# Patient Record
Sex: Female | Born: 1965 | Race: Black or African American | Hispanic: No | Marital: Married | Smoking: Current every day smoker
Health system: Southern US, Community
[De-identification: ages and names within clinical notes are randomized; demographics above are authoritative.]

## PROBLEM LIST (undated history)

## (undated) DIAGNOSIS — I10 Essential (primary) hypertension: Secondary | ICD-10-CM

## (undated) HISTORY — PX: FOOT SURGERY: SHX648

## (undated) HISTORY — PX: TUBAL LIGATION: SHX77

---

## 2015-12-07 ENCOUNTER — Emergency Department (HOSPITAL_BASED_OUTPATIENT_CLINIC_OR_DEPARTMENT_OTHER)
Admission: EM | Admit: 2015-12-07 | Discharge: 2015-12-07 | Disposition: A | Discharge status: Home / Self Care | Payer: Managed Care, Other (non HMO) | Attending: Emergency Medicine | Admitting: Emergency Medicine

## 2015-12-07 ENCOUNTER — Encounter (HOSPITAL_BASED_OUTPATIENT_CLINIC_OR_DEPARTMENT_OTHER): Payer: Self-pay | Admitting: Emergency Medicine

## 2015-12-07 ENCOUNTER — Emergency Department (HOSPITAL_BASED_OUTPATIENT_CLINIC_OR_DEPARTMENT_OTHER): Payer: Managed Care, Other (non HMO)

## 2015-12-07 DIAGNOSIS — Y998 Other external cause status: Secondary | ICD-10-CM | POA: Diagnosis not present

## 2015-12-07 DIAGNOSIS — S3992XA Unspecified injury of lower back, initial encounter: Secondary | ICD-10-CM | POA: Diagnosis not present

## 2015-12-07 DIAGNOSIS — F17200 Nicotine dependence, unspecified, uncomplicated: Secondary | ICD-10-CM | POA: Diagnosis not present

## 2015-12-07 DIAGNOSIS — Z79899 Other long term (current) drug therapy: Secondary | ICD-10-CM | POA: Insufficient documentation

## 2015-12-07 DIAGNOSIS — Y9389 Activity, other specified: Secondary | ICD-10-CM | POA: Insufficient documentation

## 2015-12-07 DIAGNOSIS — I10 Essential (primary) hypertension: Secondary | ICD-10-CM | POA: Insufficient documentation

## 2015-12-07 DIAGNOSIS — Y9289 Other specified places as the place of occurrence of the external cause: Secondary | ICD-10-CM | POA: Insufficient documentation

## 2015-12-07 DIAGNOSIS — S91002A Unspecified open wound, left ankle, initial encounter: Secondary | ICD-10-CM | POA: Diagnosis not present

## 2015-12-07 DIAGNOSIS — S99912A Unspecified injury of left ankle, initial encounter: Secondary | ICD-10-CM | POA: Diagnosis present

## 2015-12-07 DIAGNOSIS — S199XXA Unspecified injury of neck, initial encounter: Secondary | ICD-10-CM | POA: Diagnosis not present

## 2015-12-07 HISTORY — DX: Essential (primary) hypertension: I10

## 2015-12-07 MED ORDER — NAPROXEN 500 MG PO TABS: 500.0000 mg | ORAL_TABLET | Freq: Two times a day (BID) | ORAL | Status: DC

## 2015-12-07 MED ORDER — METHOCARBAMOL 500 MG PO TABS: 500.0000 mg | ORAL_TABLET | Freq: Two times a day (BID) | ORAL | Status: DC
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
## 2015-12-12 DIAGNOSIS — M778 Other enthesopathies, not elsewhere classified: Secondary | ICD-10-CM | POA: Insufficient documentation

## 2015-12-12 DIAGNOSIS — K219 Gastro-esophageal reflux disease without esophagitis: Secondary | ICD-10-CM | POA: Insufficient documentation

## 2015-12-12 DIAGNOSIS — M21619 Bunion of unspecified foot: Secondary | ICD-10-CM | POA: Insufficient documentation

## 2015-12-12 DIAGNOSIS — M25469 Effusion, unspecified knee: Secondary | ICD-10-CM | POA: Insufficient documentation

## 2015-12-12 DIAGNOSIS — J309 Allergic rhinitis, unspecified: Secondary | ICD-10-CM | POA: Insufficient documentation

## 2015-12-12 DIAGNOSIS — E7800 Pure hypercholesterolemia, unspecified: Secondary | ICD-10-CM | POA: Insufficient documentation

## 2015-12-12 DIAGNOSIS — M779 Enthesopathy, unspecified: Secondary | ICD-10-CM

## 2015-12-12 DIAGNOSIS — F17200 Nicotine dependence, unspecified, uncomplicated: Secondary | ICD-10-CM | POA: Insufficient documentation

## 2015-12-12 DIAGNOSIS — E669 Obesity, unspecified: Secondary | ICD-10-CM | POA: Insufficient documentation

## 2015-12-12 DIAGNOSIS — I10 Essential (primary) hypertension: Secondary | ICD-10-CM | POA: Insufficient documentation

## 2015-12-12 DIAGNOSIS — R7301 Impaired fasting glucose: Secondary | ICD-10-CM | POA: Insufficient documentation

## 2016-05-31 DIAGNOSIS — R319 Hematuria, unspecified: Secondary | ICD-10-CM | POA: Insufficient documentation

## 2016-05-31 DIAGNOSIS — Z0000 Encounter for general adult medical examination without abnormal findings: Secondary | ICD-10-CM | POA: Insufficient documentation

## 2017-06-06 DIAGNOSIS — S0300XA Dislocation of jaw, unspecified side, initial encounter: Secondary | ICD-10-CM | POA: Insufficient documentation

## 2018-03-14 ENCOUNTER — Ambulatory Visit (INDEPENDENT_AMBULATORY_CARE_PROVIDER_SITE_OTHER): Payer: BC Managed Care – PPO

## 2018-03-14 ENCOUNTER — Encounter: Payer: Self-pay | Admitting: Sports Medicine

## 2018-03-14 ENCOUNTER — Ambulatory Visit (INDEPENDENT_AMBULATORY_CARE_PROVIDER_SITE_OTHER): Payer: BC Managed Care – PPO | Admitting: Sports Medicine

## 2018-03-14 VITALS — BP 139/75 | HR 78 | Resp 16

## 2018-03-14 DIAGNOSIS — Q738 Other reduction defects of unspecified limb(s): Secondary | ICD-10-CM

## 2018-03-14 DIAGNOSIS — Q72899 Other reduction defects of unspecified lower limb: Secondary | ICD-10-CM

## 2018-03-14 DIAGNOSIS — M2011 Hallux valgus (acquired), right foot: Secondary | ICD-10-CM

## 2018-03-14 DIAGNOSIS — M79671 Pain in right foot: Secondary | ICD-10-CM

## 2018-03-14 NOTE — Progress Notes (Signed)
Subjective: Joan Morris is a 52 y.o. female patient who presents to office for evaluation of Right bunion pain. Patient complains of progressive pain especially over the last year in the Right foot that starts as pain over the bump with direct pressure and range of motion; patient now has difficulty fitting shoes comfortably. Ranks pain 4/10 and is now interferring with daily activities.  Patient has also tried cushions, changing shoes, and padding. Patient denies any other pedal complaints.   Review of Systems  All other systems reviewed and are negative.   There are no active problems to display for this patient.   Current Outpatient Medications on File Prior to Visit  Medication Sig Dispense Refill  . amLODipine (NORVASC) 10 MG tablet Take 10 mg by mouth daily.    . fluticasone (FLONASE) 50 MCG/ACT nasal spray Place 1 spray into both nostrils as needed for allergies or rhinitis.    Marland Kitchen lisinopril (PRINIVIL,ZESTRIL) 10 MG tablet Take 10 mg by mouth daily.    Marland Kitchen lisinopril-hydrochlorothiazide (PRINZIDE,ZESTORETIC) 20-25 MG tablet Take 1 tablet by mouth daily.    . methocarbamol (ROBAXIN) 500 MG tablet Take 1 tablet (500 mg total) by mouth 2 (two) times daily. 20 tablet 0  . naproxen (NAPROSYN) 500 MG tablet Take 1 tablet (500 mg total) by mouth 2 (two) times daily. 30 tablet 0  . omeprazole (PRILOSEC) 40 MG capsule Take by mouth daily.  3   No current facility-administered medications on file prior to visit.     Allergies  Allergen Reactions  . Lisinopril Cough  . Erythromycin Rash   Social History   Socioeconomic History  . Marital status: Married    Spouse name: Not on file  . Number of children: Not on file  . Years of education: Not on file  . Highest education level: Not on file  Occupational History  . Not on file  Social Needs  . Financial resource strain: Not on file  . Food insecurity:    Worry: Not on file    Inability: Not on file  . Transportation needs:     Medical: Not on file    Non-medical: Not on file  Tobacco Use  . Smoking status: Current Every Day Smoker    Types: Cigarettes  . Smokeless tobacco: Never Used  Substance and Sexual Activity  . Alcohol use: No  . Drug use: No  . Sexual activity: Not on file  Lifestyle  . Physical activity:    Days per week: Not on file    Minutes per session: Not on file  . Stress: Not on file  Relationships  . Social connections:    Talks on phone: Not on file    Gets together: Not on file    Attends religious service: Not on file    Active member of club or organization: Not on file    Attends meetings of clubs or organizations: Not on file    Relationship status: Not on file  Other Topics Concern  . Not on file  Social History Narrative  . Not on file   History reviewed. No pertinent family history.  Objective:  General: Alert and oriented x3 in no acute distress  Dermatology: No open lesions bilateral lower extremities, no webspace macerations, no ecchymosis bilateral, all nails x 10 are well manicured.  Vascular: Dorsalis Pedis and Posterior Tibial pedal pulses 2/4, Capillary Fill Time 3 seconds, (+) pedal hair growth bilateral, no edema bilateral lower extremities, Temperature gradient within normal limits.  Neurology: Gross sensation intact via light touch bilateral.   Musculoskeletal: Mild tenderness with palpation right>left bunion deformity, no limitation or crepitus with range of motion, deformity reducible, tracking not trackbound, there is no 1st ray hypermobility noted bilateral. Midtarsal, Subtalar joint, and ankle joint range of motion is within normal limits. On weightbearing exam, there is decreased 1st MTPJ rom Right>Left with functional limitus noted, there is medial arch collapse Right> Left on weightbearing, rearfoot slight valgus, forefoot slight abduction with HAV deformity supported on ground with no second toe crossover deformity noted however 2nd toe is long.    Gait: Non-Antalgic gait with increased medial arch collapse and pronatory influence noted on Right> Left foot with medial 1st MTPJ roll-off at toe-off, heel off within normal limits.   Xrays  Right/Left Foot    Impression: Intermetatarsal angle above normal limits supportive of bunion with hallux devation and long 2nd toe and metatarsal. Midtarsal breach supportive of pes planus.        Assessment and Plan: Problem List Items Addressed This Visit    None    Visit Diagnoses    Hav (hallux abducto valgus), right    -  Primary   Relevant Orders   DG Foot Complete Right (Completed)   Longitudinal deficiency of metatarsal bone       Right foot pain           -Complete examination performed -Xrays reviewed -Discussed treatement options; discussed HAV and long 2nd metatarsal deformity;conservative and  Surgical management (base akin with 2nd met osteotomy and toe arthroplasty; risks, benefits, alternatives discussed. All patient's questions answered.  -Dispensed bunion shield -Recommend continue with good supportive shoes and inserts.  -Patient to return to office in 4 weeks for surgrery consult or sooner if condition worsens.  Landis Martins, DPM

## 2018-04-04 ENCOUNTER — Ambulatory Visit (INDEPENDENT_AMBULATORY_CARE_PROVIDER_SITE_OTHER): Payer: BC Managed Care – PPO | Admitting: Sports Medicine

## 2018-04-04 ENCOUNTER — Encounter: Payer: Self-pay | Admitting: Sports Medicine

## 2018-04-04 ENCOUNTER — Telehealth: Payer: Self-pay | Admitting: *Deleted

## 2018-04-04 ENCOUNTER — Other Ambulatory Visit: Payer: Self-pay

## 2018-04-04 ENCOUNTER — Ambulatory Visit: Payer: Managed Care, Other (non HMO) | Admitting: Sports Medicine

## 2018-04-04 DIAGNOSIS — M2011 Hallux valgus (acquired), right foot: Secondary | ICD-10-CM | POA: Diagnosis not present

## 2018-04-04 DIAGNOSIS — M79671 Pain in right foot: Secondary | ICD-10-CM | POA: Diagnosis not present

## 2018-04-04 DIAGNOSIS — Z9889 Other specified postprocedural states: Secondary | ICD-10-CM

## 2018-04-04 DIAGNOSIS — Q738 Other reduction defects of unspecified limb(s): Secondary | ICD-10-CM | POA: Diagnosis not present

## 2018-04-04 DIAGNOSIS — Q72899 Other reduction defects of unspecified lower limb: Secondary | ICD-10-CM

## 2018-04-04 NOTE — Telephone Encounter (Signed)
Emailed and faxed order for knee scooter to [facility].

## 2018-04-04 NOTE — Progress Notes (Signed)
Subjective: Joan Morris is a 52 y.o. female patient who presents to office for follow up evaluation of Right bunion pain. Patient states she tried shield that did not help. 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 Patient denies any other pedal complaints.    There are no active problems to display for this patient.   Current Outpatient Medications on File Prior to Visit  Medication Sig Dispense Refill  . amLODipine (NORVASC) 10 MG tablet Take 10 mg by mouth daily.    . fluticasone (FLONASE) 50 MCG/ACT nasal spray Place 1 spray into both nostrils as needed for allergies or rhinitis.    Marland Kitchen lisinopril (PRINIVIL,ZESTRIL) 10 MG tablet Take 10 mg by mouth daily.    Marland Kitchen lisinopril-hydrochlorothiazide (PRINZIDE,ZESTORETIC) 20-25 MG tablet Take 1 tablet by mouth daily.    . methocarbamol (ROBAXIN) 500 MG tablet Take 1 tablet (500 mg total) by mouth 2 (two) times daily. 20 tablet 0  . naproxen (NAPROSYN) 500 MG tablet Take 1 tablet (500 mg total) by mouth 2 (two) times daily. 30 tablet 0  . omeprazole (PRILOSEC) 40 MG capsule Take by mouth daily.  3   No current facility-administered medications on file prior to visit.     Allergies  Allergen Reactions  . Codeine Other (See Comments), Nausea And Vomiting and Nausea Only  . Lisinopril Cough  . Erythromycin Rash   Social History   Socioeconomic History  . Marital status: Married    Spouse name: Not on file  . Number of children: Not on file  . Years of education: Not on file  . Highest education level: Not on file  Occupational History  . Not on file  Social Needs  . Financial resource strain: Not on file  . Food insecurity:    Worry: Not on file    Inability: Not on file  . Transportation needs:    Medical: Not on file    Non-medical: Not on file  Tobacco Use  . Smoking status: Current Every Day Smoker    Types: Cigarettes  . Smokeless tobacco: Never  Used  Substance and Sexual Activity  . Alcohol use: No  . Drug use: No  . Sexual activity: Not on file  Lifestyle  . Physical activity:    Days per week: Not on file    Minutes per session: Not on file  . Stress: Not on file  Relationships  . Social connections:    Talks on phone: Not on file    Gets together: Not on file    Attends religious service: Not on file    Active member of club or organization: Not on file    Attends meetings of clubs or organizations: Not on file    Relationship status: Not on file  Other Topics Concern  . Not on file  Social History Narrative  . Not on file   History reviewed. No pertinent family history.  Objective:  General: Alert and oriented x3 in no acute distress  Dermatology: No open lesions bilateral lower extremities, no webspace macerations, no ecchymosis bilateral, all nails x 10 are well manicured.  Vascular: Dorsalis Pedis and Posterior Tibial pedal pulses 2/4, Capillary Fill Time 3 seconds, (+) pedal hair growth bilateral, no edema bilateral lower extremities, Temperature gradient within normal limits.  Neurology: Johney Maine sensation intact via light touch bilateral.   Musculoskeletal: Mild tenderness with palpation right>left bunion deformity, no limitation or crepitus  with range of motion, deformity reducible, tracking not trackbound, there is no 1st ray hypermobility noted bilateral. Midtarsal, Subtalar joint, and ankle joint range of motion is within normal limits. On weightbearing exam, there is decreased 1st MTPJ rom Right>Left with functional limitus noted, there is medial arch collapse Right> Left on weightbearing, rearfoot slight valgus, forefoot slight abduction with HAV deformity supported on ground with no second toe crossover deformity noted however 2nd toe is long.     Assessment and Plan: Problem List Items Addressed This Visit    None    Visit Diagnoses    Hav (hallux abducto valgus), right    -  Primary   Longitudinal  deficiency of metatarsal bone       Right foot pain          -Complete examination performed -Previous Xrays reviewed -Discussed treatement options; discussed HAV and long 2nd metatarsal deformity;conservative and  Surgical management (base akin with 2nd met osteotomy and toe arthroplasty; risks, benefits, alternatives discussed. All patient's questions answered.  --Patient opt for surgical management. Consent obtained for above procedure. Pre and Post op course explained. Risks, benefits, alternatives explained. No guarantees given or implied. Surgical booking slip submitted and provided patient with Surgical packet and info for [facility] -Dispensed CAM Walker to use post op. Will dispense crutches at surgical center and will request knee scooter anticipate 3-4 weeks NWB depending on xrays and healing  -Patient to return to office after surgery or sooner if condition worsens.  Landis Martins, DPM

## 2018-04-04 NOTE — Patient Instructions (Signed)
Pre-Operative Instructions  Congratulations, you have decided to take an important step towards improving your quality of life.  You can be assured that the doctors and staff at [facility] will be with you every step of the way.  Here are some important things you should know:  1. Plan to be at the surgery center/hospital at least 1 (one) hour prior to your scheduled time, unless otherwise directed by the surgical center/hospital staff.  You must have a responsible adult accompany you, remain during the surgery and drive you home.  Make sure you have directions to the surgical center/hospital to ensure you arrive on time. 2. If you are having surgery at [facility] or [facility], you will need a copy of your medical history and physical form from your family physician within one month prior to the date of surgery. We will give you a form for your primary physician to complete.  3. We make every effort to accommodate the date you request for surgery.  However, there are times where surgery dates or times have to be moved.  We will contact you as soon as possible if a change in schedule is required.   4. No aspirin/ibuprofen for one week before surgery.  If you are on aspirin, any non-steroidal anti-inflammatory medications (Mobic, Aleve, Ibuprofen) should not be taken seven (7) days prior to your surgery.  You make take Tylenol for pain prior to surgery.  5. Medications - If you are taking daily heart and blood pressure medications, seizure, reflux, allergy, asthma, anxiety, pain or diabetes medications, make sure you notify the surgery center/hospital before the day of surgery so they can tell you which medications you should take or avoid the day of surgery. 6. No food or drink after midnight the night before surgery unless directed otherwise by surgical center/hospital staff. 7. No alcoholic beverages 24-hours prior to surgery.  No smoking 24-hours prior or 24-hours after  surgery. 8. Wear loose pants or shorts. They should be loose enough to fit over bandages, boots, and casts. 9. Don't wear slip-on shoes. Sneakers are preferred. 10. Bring your boot with you to the surgery center/hospital.  Also bring crutches or a walker if your physician has prescribed it for you.  If you do not have this equipment, it will be provided for you after surgery. 11. If you have not been contacted by the surgery center/hospital by the day before your surgery, call to confirm the date and time of your surgery. 12. Leave-time from work may vary depending on the type of surgery you have.  Appropriate arrangements should be made prior to surgery with your employer. 13. Prescriptions will be provided immediately following surgery by your doctor.  Fill these as soon as possible after surgery and take the medication as directed. Pain medications will not be refilled on weekends and must be approved by the doctor. 14. Remove nail polish on the operative foot and avoid getting pedicures prior to surgery. 15. Wash the night before surgery.  The night before surgery wash the foot and leg well with water and the antibacterial soap provided. Be sure to pay special attention to beneath the toenails and in between the toes.  Wash for at least three (3) minutes. Rinse thoroughly with water and dry well with a towel.  Perform this wash unless told not to do so by your physician.  Enclosed: 1 Ice pack (please put in freezer the night before surgery)   1 Hibiclens skin cleaner     Pre-op instructions  If you have any questions regarding the instructions, please do not hesitate to call our office.  [place]: [address], [place], [place] [address] -- [phone]  [place]: [address] [place], [place] [address] -- [phone]  [place]: [address]  [place], [place] [address] -- [phone]  [place]: [address], [address], [place],  [address] -- [phone]  Website:  [email or web address] 

## 2018-04-04 NOTE — Telephone Encounter (Signed)
I informed pt of [facility]per [facility] statement concerning insurance. Mailed rx to pt for [facility] knee scooter.

## 2018-04-04 NOTE — Telephone Encounter (Signed)
-----   Message from Asencion Islam, [place] sent at 04/04/2018  2:20 PM EDT ----- Regarding: Knee scoooter  Please order patient a knee scooter to use after right foot bunion surgery

## 2018-04-04 NOTE — Telephone Encounter (Signed)
Joan Morris states pt's insurance is not in-network with [facility], offer pt private medical supplier for knee scooter.

## 2018-04-12 DIAGNOSIS — M79676 Pain in unspecified toe(s): Secondary | ICD-10-CM

## 2018-04-24 ENCOUNTER — Encounter: Payer: Self-pay | Admitting: Sports Medicine

## 2018-04-24 DIAGNOSIS — M21541 Acquired clubfoot, right foot: Secondary | ICD-10-CM

## 2018-04-24 DIAGNOSIS — M2041 Other hammer toe(s) (acquired), right foot: Secondary | ICD-10-CM

## 2018-04-24 DIAGNOSIS — M2011 Hallux valgus (acquired), right foot: Secondary | ICD-10-CM

## 2018-04-25 ENCOUNTER — Telehealth: Payer: Self-pay | Admitting: Sports Medicine

## 2018-04-25 NOTE — Telephone Encounter (Signed)
Post op check phone call made to patient. Patient reports that she is doing ok. Had 1 episode of nausea that was relieved by Phenergan. Denies any other problems. Patient asks is it ok to take off boot, I advised patient that she may take of when sitting/sleep if she is not a restless sleeper and must replace boot back on foot anytime she is ambulatory with crutches. Patient to follow up as scheduled or sooner if any problems. Dr. Marylene LandStover

## 2018-04-26 ENCOUNTER — Encounter: Payer: Self-pay | Admitting: Sports Medicine

## 2018-05-02 ENCOUNTER — Encounter: Payer: Self-pay | Admitting: Sports Medicine

## 2018-05-02 ENCOUNTER — Ambulatory Visit (INDEPENDENT_AMBULATORY_CARE_PROVIDER_SITE_OTHER): Payer: BC Managed Care – PPO | Admitting: Sports Medicine

## 2018-05-02 ENCOUNTER — Ambulatory Visit (INDEPENDENT_AMBULATORY_CARE_PROVIDER_SITE_OTHER): Payer: BC Managed Care – PPO

## 2018-05-02 VITALS — BP 144/90 | HR 87 | Temp 98.2°F

## 2018-05-02 DIAGNOSIS — M2041 Other hammer toe(s) (acquired), right foot: Secondary | ICD-10-CM

## 2018-05-02 DIAGNOSIS — M2011 Hallux valgus (acquired), right foot: Secondary | ICD-10-CM

## 2018-05-02 DIAGNOSIS — Q738 Other reduction defects of unspecified limb(s): Secondary | ICD-10-CM

## 2018-05-02 DIAGNOSIS — Q72899 Other reduction defects of unspecified lower limb: Secondary | ICD-10-CM

## 2018-05-02 DIAGNOSIS — Z9889 Other specified postprocedural states: Secondary | ICD-10-CM

## 2018-05-02 DIAGNOSIS — M79671 Pain in right foot: Secondary | ICD-10-CM

## 2018-05-02 NOTE — Progress Notes (Signed)
Subjective: Joan Morris is a 52 y.o. female patient seen today in office for POV 1 (DOS 04-24-18, S/P R bunionectomy and 2nd hammertoe with 2nd met osteotomy. Patient denies pain at surgical site, denies calf pain, denies headache, chest pain, shortness of breath, nausea, vomiting, fever, or chills. Patient states that she is doing well and is only taking Advil. No other issues noted.   There are no active problems to display for this patient.   Current Outpatient Medications on File Prior to Visit  Medication Sig Dispense Refill  . amLODipine (NORVASC) 10 MG tablet Take 10 mg by mouth daily.    Marland Kitchen buPROPion (WELLBUTRIN XL) 150 MG 24 hr tablet Take by mouth.    . fluticasone (FLONASE) 50 MCG/ACT nasal spray Place 1 spray into both nostrils as needed for allergies or rhinitis.    Marland Kitchen lisinopril (PRINIVIL,ZESTRIL) 10 MG tablet Take 10 mg by mouth daily.    Marland Kitchen lisinopril-hydrochlorothiazide (PRINZIDE,ZESTORETIC) 20-25 MG tablet Take 1 tablet by mouth daily.    Marland Kitchen losartan (COZAAR) 50 MG tablet Take by mouth.    . methocarbamol (ROBAXIN) 500 MG tablet Take 1 tablet (500 mg total) by mouth 2 (two) times daily. 20 tablet 0  . naproxen (NAPROSYN) 500 MG tablet Take 1 tablet (500 mg total) by mouth 2 (two) times daily. 30 tablet 0  . omeprazole (PRILOSEC) 40 MG capsule Take by mouth daily.  3   No current facility-administered medications on file prior to visit.     Allergies  Allergen Reactions  . Codeine Other (See Comments), Nausea And Vomiting and Nausea Only  . Lisinopril Cough  . Erythromycin Rash    Objective: There were no vitals filed for this visit.  General: No acute distress, AAOx3  Right foot: Kwire and Sutures intact with no gapping or dehiscence at surgical site, mild swelling to right foot, no erythema, no warmth, no drainage, no signs of infection noted, Capillary fill time <3 seconds in all digits, gross sensation present via light touch to right/left foot. No pain or crepitation  with range of motion right/left foot.  No pain with calf compression.   Post Op Xray, Right foot: 1st ray and 2nd toe in excellent alignment and position. Osteotomy site healing. Hardware intact. Soft tissue swelling within normal limits for post op status.   Assessment and Plan:  Problem List Items Addressed This Visit    None    Visit Diagnoses    Hammer toe of right foot    -  Primary   Relevant Orders   DG Foot Complete Right   Hav (hallux abducto valgus), right       Longitudinal deficiency of metatarsal bone       Post-operative state       Right foot pain           -Patient seen and evaluated -Xrays reviewed  -Applied dry sterile dressing to surgical site right foot secured with ACE wrap and stockinet  -Advised patient to make sure to keep dressings clean, dry, and intact to right surgical site, removing the ACE as needed  -Advised patient to continue with CAM boot and crutches  -Advised patient to limit activity to necessity  -Advised patient to ice and elevate as necessary  -Continue Advil PRN  -Will plan for possible suture removal at next office visit. In the meantime, patient to call office if any issues or problems arise.   Landis Martins, DPM

## 2018-05-09 ENCOUNTER — Ambulatory Visit (INDEPENDENT_AMBULATORY_CARE_PROVIDER_SITE_OTHER): Payer: Self-pay | Admitting: Sports Medicine

## 2018-05-09 ENCOUNTER — Encounter: Payer: Self-pay | Admitting: Sports Medicine

## 2018-05-09 DIAGNOSIS — Z9889 Other specified postprocedural states: Secondary | ICD-10-CM

## 2018-05-09 DIAGNOSIS — M2011 Hallux valgus (acquired), right foot: Secondary | ICD-10-CM

## 2018-05-09 DIAGNOSIS — M2041 Other hammer toe(s) (acquired), right foot: Secondary | ICD-10-CM

## 2018-05-09 DIAGNOSIS — Q738 Other reduction defects of unspecified limb(s): Secondary | ICD-10-CM

## 2018-05-09 DIAGNOSIS — Q72899 Other reduction defects of unspecified lower limb: Secondary | ICD-10-CM

## 2018-05-09 NOTE — Progress Notes (Signed)
Subjective: Joan Morris is a 52 y.o. female patient seen today in office for POV 2 (DOS 04-24-18), S/P R bunionectomy and 2nd hammertoe with 2nd met osteotomy. Patient denies pain at surgical site, denies calf pain, denies headache, chest pain, shortness of breath, vomiting, fever, or chills. Patient states that she is doing well and is only taking Advil and Asprin as recommended for prevention agnaist DVT. Had 1 episode of nausea that was relieved with anti-nausea medication. No other issues noted.   There are no active problems to display for this patient.   Current Outpatient Medications on File Prior to Visit  Medication Sig Dispense Refill  . amLODipine (NORVASC) 10 MG tablet Take 10 mg by mouth daily.    Marland Kitchen buPROPion (WELLBUTRIN XL) 150 MG 24 hr tablet Take by mouth.    . fluticasone (FLONASE) 50 MCG/ACT nasal spray Place 1 spray into both nostrils as needed for allergies or rhinitis.    Marland Kitchen lisinopril (PRINIVIL,ZESTRIL) 10 MG tablet Take 10 mg by mouth daily.    Marland Kitchen lisinopril-hydrochlorothiazide (PRINZIDE,ZESTORETIC) 20-25 MG tablet Take 1 tablet by mouth daily.    Marland Kitchen losartan (COZAAR) 50 MG tablet Take by mouth.    . methocarbamol (ROBAXIN) 500 MG tablet Take 1 tablet (500 mg total) by mouth 2 (two) times daily. 20 tablet 0  . naproxen (NAPROSYN) 500 MG tablet Take 1 tablet (500 mg total) by mouth 2 (two) times daily. 30 tablet 0  . omeprazole (PRILOSEC) 40 MG capsule Take by mouth daily.  3   No current facility-administered medications on file prior to visit.     Allergies  Allergen Reactions  . Codeine Other (See Comments), Nausea And Vomiting and Nausea Only  . Lisinopril Cough  . Erythromycin Rash    Objective: There were no vitals filed for this visit.  General: No acute distress, AAOx3  Right foot: Kwire and Sutures intact with no gapping or dehiscence at surgical site, mild swelling to right foot, no erythema, no warmth, no drainage, no signs of infection noted, Capillary  fill time <3 seconds in all digits, gross sensation present via light touch to right/left foot. No pain or crepitation with range of motion right/left foot.  No pain with calf compression.   Assessment and Plan:  Problem List Items Addressed This Visit    None    Visit Diagnoses    Hammer toe of right foot    -  Primary   Hav (hallux abducto valgus), right       Longitudinal deficiency of metatarsal bone       Post-operative state           -Patient seen and evaluated -Applied dry sterile dressing to surgical site right foot secured with ACE wrap and stockinet  -Advised patient to make sure to keep dressings clean, dry, and intact to right surgical site, removing the ACE as needed  -Advised patient to continue with CAM boot and crutches  -Advised patient to limit activity to necessity  -Advised patient to ice and elevate as necessary  -Continue Advil PRN and Aspirin to prevent against DVT -Will plan for topical lidocaine and suture removal at next office visit. In the meantime, patient to call office if any issues or problems arise.   Landis Martins, DPM

## 2018-05-16 ENCOUNTER — Encounter: Payer: Self-pay | Admitting: Sports Medicine

## 2018-05-16 ENCOUNTER — Ambulatory Visit (INDEPENDENT_AMBULATORY_CARE_PROVIDER_SITE_OTHER): Payer: Self-pay | Admitting: Sports Medicine

## 2018-05-16 DIAGNOSIS — M2041 Other hammer toe(s) (acquired), right foot: Secondary | ICD-10-CM

## 2018-05-16 DIAGNOSIS — M79671 Pain in right foot: Secondary | ICD-10-CM

## 2018-05-16 DIAGNOSIS — Z9889 Other specified postprocedural states: Secondary | ICD-10-CM

## 2018-05-16 DIAGNOSIS — Q738 Other reduction defects of unspecified limb(s): Secondary | ICD-10-CM

## 2018-05-16 DIAGNOSIS — Q72899 Other reduction defects of unspecified lower limb: Secondary | ICD-10-CM

## 2018-05-16 DIAGNOSIS — M2011 Hallux valgus (acquired), right foot: Secondary | ICD-10-CM

## 2018-05-16 NOTE — Progress Notes (Signed)
Subjective: Joan Morris is a 52 y.o. female patient seen today in office for POV 3 (DOS 04-24-18), S/P R bunionectomy and 2nd hammertoe with 2nd met osteotomy. Patient admits a little pain at surgical site that's improving, denies calf pain, denies headache, chest pain, shortness of breath, vomiting, fever, or chills. Patient states that she is only taking Advil and Asprin as recommended for prevention agnaist DVT. No other issues noted.   There are no active problems to display for this patient.   Current Outpatient Medications on File Prior to Visit  Medication Sig Dispense Refill  . amLODipine (NORVASC) 10 MG tablet Take 10 mg by mouth daily.    Marland Kitchen buPROPion (WELLBUTRIN XL) 150 MG 24 hr tablet Take by mouth.    . fluticasone (FLONASE) 50 MCG/ACT nasal spray Place 1 spray into both nostrils as needed for allergies or rhinitis.    Marland Kitchen lisinopril (PRINIVIL,ZESTRIL) 10 MG tablet Take 10 mg by mouth daily.    Marland Kitchen lisinopril-hydrochlorothiazide (PRINZIDE,ZESTORETIC) 20-25 MG tablet Take 1 tablet by mouth daily.    Marland Kitchen losartan (COZAAR) 50 MG tablet Take by mouth.    . methocarbamol (ROBAXIN) 500 MG tablet Take 1 tablet (500 mg total) by mouth 2 (two) times daily. 20 tablet 0  . naproxen (NAPROSYN) 500 MG tablet Take 1 tablet (500 mg total) by mouth 2 (two) times daily. 30 tablet 0  . omeprazole (PRILOSEC) 40 MG capsule Take by mouth daily.  3   No current facility-administered medications on file prior to visit.     Allergies  Allergen Reactions  . Codeine Other (See Comments), Nausea And Vomiting and Nausea Only  . Lisinopril Cough  . Erythromycin Rash    Objective: There were no vitals filed for this visit.  General: No acute distress, AAOx3  Right foot: Kwire and Sutures intact with no gapping or dehiscence at surgical site, mild swelling to right foot, no erythema, no warmth, no drainage, no signs of infection noted, Capillary fill time <3 seconds in all digits, gross sensation present via  light touch to right/left foot. No pain or crepitation with range of motion right/left foot.  No pain with calf compression.   Assessment and Plan:  Problem List Items Addressed This Visit    None    Visit Diagnoses    Hammer toe of right foot    -  Primary   Hav (hallux abducto valgus), right       Longitudinal deficiency of metatarsal bone       Post-operative state       Right foot pain          -Patient seen and evaluated -Every other suture was removed. Applied dry sterile dressing to surgical site right foot secured with ACE wrap and stockinet  -Advised patient to make sure to keep dressings clean, dry, and intact to right surgical site, removing the ACE as needed  -Advised patient to continue with CAM boot and crutches  -Advised patient to limit activity to necessity  -Advised patient to ice and elevate as necessary  -Continue Advil PRN and Aspirin to prevent against DVT -Will plan for xray,finishing suture removal, and remove kwire and allow WB with CAM boot at next office visit. In the meantime, patient to call office if any issues or problems arise.   Landis Martins, DPM

## 2018-05-23 ENCOUNTER — Ambulatory Visit (INDEPENDENT_AMBULATORY_CARE_PROVIDER_SITE_OTHER): Payer: BC Managed Care – PPO

## 2018-05-23 ENCOUNTER — Ambulatory Visit (INDEPENDENT_AMBULATORY_CARE_PROVIDER_SITE_OTHER): Payer: BC Managed Care – PPO | Admitting: Sports Medicine

## 2018-05-23 ENCOUNTER — Encounter: Payer: Self-pay | Admitting: Sports Medicine

## 2018-05-23 DIAGNOSIS — M2041 Other hammer toe(s) (acquired), right foot: Secondary | ICD-10-CM | POA: Diagnosis not present

## 2018-05-23 DIAGNOSIS — M79671 Pain in right foot: Secondary | ICD-10-CM

## 2018-05-23 DIAGNOSIS — M2011 Hallux valgus (acquired), right foot: Secondary | ICD-10-CM

## 2018-05-23 DIAGNOSIS — Q738 Other reduction defects of unspecified limb(s): Secondary | ICD-10-CM

## 2018-05-23 DIAGNOSIS — Z9889 Other specified postprocedural states: Secondary | ICD-10-CM

## 2018-05-23 DIAGNOSIS — Q72899 Other reduction defects of unspecified lower limb: Secondary | ICD-10-CM

## 2018-05-23 NOTE — Progress Notes (Signed)
Subjective: Joan Morris is a 52 y.o. female patient seen today in office for POV 4 (DOS 04-24-18), S/P R bunionectomy and 2nd hammertoe with 2nd met osteotomy. Patient states pain is better, itchy around sutures, denies calf pain, denies headache, chest pain, shortness of breath, vomiting, fever, or chills. Patient states that she is only taking Advil and Asprin as recommended for prevention agnaist DVT. No other issues noted.   There are no active problems to display for this patient.   Current Outpatient Medications on File Prior to Visit  Medication Sig Dispense Refill  . amLODipine (NORVASC) 10 MG tablet Take 10 mg by mouth daily.    Marland Kitchen buPROPion (WELLBUTRIN XL) 150 MG 24 hr tablet Take by mouth.    . fluticasone (FLONASE) 50 MCG/ACT nasal spray Place 1 spray into both nostrils as needed for allergies or rhinitis.    Marland Kitchen lisinopril (PRINIVIL,ZESTRIL) 10 MG tablet Take 10 mg by mouth daily.    Marland Kitchen lisinopril-hydrochlorothiazide (PRINZIDE,ZESTORETIC) 20-25 MG tablet Take 1 tablet by mouth daily.    Marland Kitchen losartan (COZAAR) 50 MG tablet Take by mouth.    . methocarbamol (ROBAXIN) 500 MG tablet Take 1 tablet (500 mg total) by mouth 2 (two) times daily. 20 tablet 0  . naproxen (NAPROSYN) 500 MG tablet Take 1 tablet (500 mg total) by mouth 2 (two) times daily. 30 tablet 0  . omeprazole (PRILOSEC) 40 MG capsule Take by mouth daily.  3   No current facility-administered medications on file prior to visit.     Allergies  Allergen Reactions  . Codeine Other (See Comments), Nausea And Vomiting and Nausea Only  . Lisinopril Cough  . Erythromycin Rash    Objective: There were no vitals filed for this visit.  General: No acute distress, AAOx3  Right foot: Kwire and Sutures intact with no gapping or dehiscence at surgical site, mild swelling to right foot, no erythema, no warmth, no drainage, no signs of infection noted, Capillary fill time <3 seconds in all digits, gross sensation present via light touch  to right/left foot. No pain or crepitation with range of motion right foot.  No pain with calf compression.   Xrays within normal limits for post op status  Assessment and Plan:  Problem List Items Addressed This Visit    None    Visit Diagnoses    Hammer toe of right foot    -  Primary   Relevant Orders   DG Foot Complete Right   Hav (hallux abducto valgus), right       Longitudinal deficiency of metatarsal bone       Post-operative state       Right foot pain          -Patient seen and evaluated -X-rays reviewed -Sutures and K-wire was removed -Due gapping and swelling at 2nd toe redressed area with steri-strips and dry dressing and advised patient to keep clean and dry until next office visit -May weight-bear with boot -Conutine with rest, ice, elevation and ace to assist with swelling and edema control -Will plan for incision check at next office visit. In the meantime, patient to call office if any issues or problems arise.   Landis Martins, DPM

## 2018-05-30 ENCOUNTER — Ambulatory Visit (INDEPENDENT_AMBULATORY_CARE_PROVIDER_SITE_OTHER): Payer: BC Managed Care – PPO | Admitting: Sports Medicine

## 2018-05-30 DIAGNOSIS — M79671 Pain in right foot: Secondary | ICD-10-CM

## 2018-05-30 DIAGNOSIS — M2041 Other hammer toe(s) (acquired), right foot: Secondary | ICD-10-CM

## 2018-05-30 DIAGNOSIS — Z9889 Other specified postprocedural states: Secondary | ICD-10-CM

## 2018-05-30 DIAGNOSIS — Q72899 Other reduction defects of unspecified lower limb: Secondary | ICD-10-CM

## 2018-05-30 DIAGNOSIS — M2011 Hallux valgus (acquired), right foot: Secondary | ICD-10-CM

## 2018-05-30 DIAGNOSIS — Q738 Other reduction defects of unspecified limb(s): Secondary | ICD-10-CM

## 2018-05-30 NOTE — Progress Notes (Signed)
Subjective: Joan Morris is a 52 y.o. female patient seen today in office for POV 5 (DOS 04-24-18), S/P R bunionectomy and 2nd hammertoe with 2nd met osteotomy. Patient states "pain at times but not a lot", denies calf pain, denies headache, chest pain, shortness of breath, vomiting, fever, or chills. Patient states that she is only taking Advil. No other issues noted.   There are no active problems to display for this patient.   Current Outpatient Medications on File Prior to Visit  Medication Sig Dispense Refill  . amLODipine (NORVASC) 10 MG tablet Take 10 mg by mouth daily.    Marland Kitchen buPROPion (WELLBUTRIN XL) 150 MG 24 hr tablet Take by mouth.    . fluticasone (FLONASE) 50 MCG/ACT nasal spray Place 1 spray into both nostrils as needed for allergies or rhinitis.    Marland Kitchen lisinopril (PRINIVIL,ZESTRIL) 10 MG tablet Take 10 mg by mouth daily.    Marland Kitchen lisinopril-hydrochlorothiazide (PRINZIDE,ZESTORETIC) 20-25 MG tablet Take 1 tablet by mouth daily.    Marland Kitchen losartan (COZAAR) 50 MG tablet Take by mouth.    . methocarbamol (ROBAXIN) 500 MG tablet Take 1 tablet (500 mg total) by mouth 2 (two) times daily. 20 tablet 0  . naproxen (NAPROSYN) 500 MG tablet Take 1 tablet (500 mg total) by mouth 2 (two) times daily. 30 tablet 0  . omeprazole (PRILOSEC) 40 MG capsule Take by mouth daily.  3   No current facility-administered medications on file prior to visit.     Allergies  Allergen Reactions  . Codeine Other (See Comments), Nausea And Vomiting and Nausea Only  . Lisinopril Cough  . Erythromycin Rash    Objective: There were no vitals filed for this visit.  General: No acute distress, AAOx3  Right foot: Scabbing at surgical sites, mild swelling to right foot, no erythema, no warmth, no drainage, no signs of infection noted, Capillary fill time <3 seconds in all digits, gross sensation present via light touch to right foot. No pain or crepitation with range of motion right foot.  No pain with calf compression.    Assessment and Plan:  Problem List Items Addressed This Visit    None    Visit Diagnoses    Hammer toe of right foot    -  Primary   Hav (hallux abducto valgus), right       Longitudinal deficiency of metatarsal bone       Post-operative state       Right foot pain          -Patient seen and evaluated -May shower as normal -Rx Surgical shoe and compression anklet  -Conutine with rest, ice, elevation and ace to assist with swelling and edema control -Will plan for range of motion and scar creams at next office visit. In the meantime, patient to call office if any issues or problems arise.   Landis Martins, DPM

## 2018-06-12 DIAGNOSIS — E876 Hypokalemia: Secondary | ICD-10-CM | POA: Insufficient documentation

## 2018-06-13 ENCOUNTER — Ambulatory Visit (INDEPENDENT_AMBULATORY_CARE_PROVIDER_SITE_OTHER): Payer: BC Managed Care – PPO | Admitting: Sports Medicine

## 2018-06-13 ENCOUNTER — Encounter: Payer: Self-pay | Admitting: Sports Medicine

## 2018-06-13 DIAGNOSIS — T8130XA Disruption of wound, unspecified, initial encounter: Secondary | ICD-10-CM

## 2018-06-13 DIAGNOSIS — Q72899 Other reduction defects of unspecified lower limb: Secondary | ICD-10-CM

## 2018-06-13 DIAGNOSIS — Q738 Other reduction defects of unspecified limb(s): Secondary | ICD-10-CM

## 2018-06-13 DIAGNOSIS — M79671 Pain in right foot: Secondary | ICD-10-CM

## 2018-06-13 DIAGNOSIS — M2011 Hallux valgus (acquired), right foot: Secondary | ICD-10-CM

## 2018-06-13 DIAGNOSIS — M2041 Other hammer toe(s) (acquired), right foot: Secondary | ICD-10-CM

## 2018-06-13 DIAGNOSIS — Z9889 Other specified postprocedural states: Secondary | ICD-10-CM

## 2018-06-13 NOTE — Progress Notes (Signed)
Subjective: Joan Morris is a 52 y.o. female patient seen today in office for POV 6 (DOS 04-24-18), S/P R bunionectomy and 2nd hammertoe with 2nd met osteotomy. Patient states "pain at times but not a lot to the area", denies calf pain, denies headache, chest pain, shortness of breath, vomiting, fever, or chills. No other issues noted.   Patient Active Problem List   Diagnosis Date Noted  . Hypokalemia 06/12/2018  . TMJ (dislocation of temporomandibular joint) 06/06/2017  . Annual physical exam 05/31/2016  . Hematuria 05/31/2016  . Allergic rhinitis 12/12/2015  . Benign essential hypertension 12/12/2015  . Bunion 12/12/2015  . Capsulitis of foot 12/12/2015  . Class 1 obesity in adult 12/12/2015  . Effusion of knee 12/12/2015  . Gastroesophageal reflux disease without esophagitis 12/12/2015  . Impaired fasting glucose 12/12/2015  . Pure hypercholesterolemia 12/12/2015  . Tobacco dependence 12/12/2015    Current Outpatient Medications on File Prior to Visit  Medication Sig Dispense Refill  . amLODipine (NORVASC) 10 MG tablet Take 10 mg by mouth daily.    Marland Kitchen buPROPion (WELLBUTRIN XL) 150 MG 24 hr tablet Take by mouth.    . fluticasone (FLONASE) 50 MCG/ACT nasal spray Place 1 spray into both nostrils as needed for allergies or rhinitis.    Marland Kitchen lisinopril (PRINIVIL,ZESTRIL) 10 MG tablet Take 10 mg by mouth daily.    Marland Kitchen lisinopril-hydrochlorothiazide (PRINZIDE,ZESTORETIC) 20-25 MG tablet Take 1 tablet by mouth daily.    Marland Kitchen losartan (COZAAR) 50 MG tablet Take by mouth.    . methocarbamol (ROBAXIN) 500 MG tablet Take 1 tablet (500 mg total) by mouth 2 (two) times daily. 20 tablet 0  . naproxen (NAPROSYN) 500 MG tablet Take 1 tablet (500 mg total) by mouth 2 (two) times daily. 30 tablet 0  . omeprazole (PRILOSEC) 40 MG capsule Take by mouth daily.  3   No current facility-administered medications on file prior to visit.     Allergies  Allergen Reactions  . Codeine Other (See Comments), Nausea  And Vomiting and Nausea Only  . Lisinopril Cough  . Erythromycin Rash    Objective: There were no vitals filed for this visit.  General: No acute distress, AAOx3  Right foot: Mild gapping sites, mild swelling to right foot, no erythema, no warmth, no drainage, no signs of infection noted, Capillary fill time <3 seconds in all digits, gross sensation present via light touch to right foot. No pain or crepitation with range of motion right foot.  No pain with calf compression.   Assessment and Plan:  Problem List Items Addressed This Visit    None    Visit Diagnoses    Wound dehiscence    -  Primary   Hammer toe of right foot       Hav (hallux abducto valgus), right       Longitudinal deficiency of metatarsal bone       Post-operative state       Right foot pain          -Patient seen and evaluated -Applied antibiotic cream to suture line and advised patient to do the same -Continue with Surgical shoe and compression anklet  -Conutine with rest, ice, elevation and ace to assist with swelling and edema control -Will plan for range of motion and scar creams at next office visit if areas of dehiscance is better. In the meantime, patient to call office if any issues or problems arise.   Landis Martins, DPM

## 2018-06-27 ENCOUNTER — Encounter: Payer: Self-pay | Admitting: Sports Medicine

## 2018-06-27 ENCOUNTER — Ambulatory Visit (INDEPENDENT_AMBULATORY_CARE_PROVIDER_SITE_OTHER): Payer: BC Managed Care – PPO

## 2018-06-27 ENCOUNTER — Ambulatory Visit (INDEPENDENT_AMBULATORY_CARE_PROVIDER_SITE_OTHER): Payer: BC Managed Care – PPO | Admitting: Sports Medicine

## 2018-06-27 DIAGNOSIS — M79671 Pain in right foot: Secondary | ICD-10-CM

## 2018-06-27 DIAGNOSIS — M2011 Hallux valgus (acquired), right foot: Secondary | ICD-10-CM | POA: Diagnosis not present

## 2018-06-27 DIAGNOSIS — M2041 Other hammer toe(s) (acquired), right foot: Secondary | ICD-10-CM | POA: Diagnosis not present

## 2018-06-27 DIAGNOSIS — Z9889 Other specified postprocedural states: Secondary | ICD-10-CM

## 2018-06-27 DIAGNOSIS — Q738 Other reduction defects of unspecified limb(s): Secondary | ICD-10-CM

## 2018-06-27 DIAGNOSIS — Q72899 Other reduction defects of unspecified lower limb: Secondary | ICD-10-CM

## 2018-06-27 NOTE — Progress Notes (Signed)
Subjective: Joan Morris is a 52 y.o. female patient seen today in office for POV 7 (DOS 04-24-18), S/P R bunionectomy and 2nd hammertoe with 2nd met osteotomy. Patient states "soreness but otherwise is doing good", denies calf pain, denies headache, chest pain, shortness of breath, vomiting, fever, or chills. No other issues noted.   Patient Active Problem List   Diagnosis Date Noted  . Hypokalemia 06/12/2018  . TMJ (dislocation of temporomandibular joint) 06/06/2017  . Annual physical exam 05/31/2016  . Hematuria 05/31/2016  . Allergic rhinitis 12/12/2015  . Benign essential hypertension 12/12/2015  . Bunion 12/12/2015  . Capsulitis of foot 12/12/2015  . Class 1 obesity in adult 12/12/2015  . Effusion of knee 12/12/2015  . Gastroesophageal reflux disease without esophagitis 12/12/2015  . Impaired fasting glucose 12/12/2015  . Pure hypercholesterolemia 12/12/2015  . Tobacco dependence 12/12/2015    Current Outpatient Medications on File Prior to Visit  Medication Sig Dispense Refill  . amLODipine (NORVASC) 10 MG tablet Take 10 mg by mouth daily.    Marland Kitchen buPROPion (WELLBUTRIN XL) 150 MG 24 hr tablet Take by mouth.    . fluticasone (FLONASE) 50 MCG/ACT nasal spray Place 1 spray into both nostrils as needed for allergies or rhinitis.    Marland Kitchen lisinopril (PRINIVIL,ZESTRIL) 10 MG tablet Take 10 mg by mouth daily.    Marland Kitchen lisinopril-hydrochlorothiazide (PRINZIDE,ZESTORETIC) 20-25 MG tablet Take 1 tablet by mouth daily.    Marland Kitchen losartan (COZAAR) 50 MG tablet Take by mouth.    . methocarbamol (ROBAXIN) 500 MG tablet Take 1 tablet (500 mg total) by mouth 2 (two) times daily. 20 tablet 0  . naproxen (NAPROSYN) 500 MG tablet Take 1 tablet (500 mg total) by mouth 2 (two) times daily. 30 tablet 0  . omeprazole (PRILOSEC) 40 MG capsule Take by mouth daily.  3   No current facility-administered medications on file prior to visit.     Allergies  Allergen Reactions  . Codeine Other (See Comments), Nausea And  Vomiting and Nausea Only  . Lisinopril Cough  . Erythromycin Rash    Objective: There were no vitals filed for this visit.  General: No acute distress, AAOx3  Right foot: Incisions healed, mild swelling to right foot, no erythema, no warmth, no drainage, no signs of infection noted, Capillary fill time <3 seconds in all digits, gross sensation present via light touch to right foot. No pain or crepitation with range of motion right foot.  No pain with calf compression.   Xray: gapping at base osteotomy with screws intact no other findings  Assessment and Plan:  Problem List Items Addressed This Visit    None    Visit Diagnoses    Hammer toe of right foot    -  Primary   Relevant Orders   DG Foot Complete Right (Completed)   Hav (hallux abducto valgus), right       Relevant Orders   DG Foot Complete Right (Completed)   Longitudinal deficiency of metatarsal bone       Post-operative state       Right foot pain          -Patient seen and evaluated -Xrays evaluated  -Advised patient to return to CAM boot or  Surgical shoe of which she has not been wearing in order to protect 1st metatarsal from rotating and screws from breaking -Will look into bone stim -Continue with compression anklet  -Conutine with rest, ice, elevation and ace to assist with swelling and edema control -No  work until next office visit -Will plan for xrays and to assess healing at next office visit. In the meantime, patient to call office if any issues or problems arise.   Landis Martins, DPM

## 2018-07-25 ENCOUNTER — Encounter: Payer: Self-pay | Admitting: Sports Medicine

## 2018-07-25 ENCOUNTER — Ambulatory Visit (INDEPENDENT_AMBULATORY_CARE_PROVIDER_SITE_OTHER): Payer: Self-pay | Admitting: Sports Medicine

## 2018-07-25 ENCOUNTER — Ambulatory Visit (INDEPENDENT_AMBULATORY_CARE_PROVIDER_SITE_OTHER): Payer: BC Managed Care – PPO

## 2018-07-25 DIAGNOSIS — Q72899 Other reduction defects of unspecified lower limb: Secondary | ICD-10-CM

## 2018-07-25 DIAGNOSIS — Q738 Other reduction defects of unspecified limb(s): Secondary | ICD-10-CM

## 2018-07-25 DIAGNOSIS — Z9889 Other specified postprocedural states: Secondary | ICD-10-CM

## 2018-07-25 DIAGNOSIS — M2011 Hallux valgus (acquired), right foot: Secondary | ICD-10-CM

## 2018-07-25 DIAGNOSIS — M2041 Other hammer toe(s) (acquired), right foot: Secondary | ICD-10-CM | POA: Diagnosis not present

## 2018-07-25 NOTE — Progress Notes (Signed)
Subjective: Joan Morris is a 52 y.o. female patient seen today in office for POV 8 (DOS 04-24-18), S/P R bunionectomy and 2nd hammertoe with 2nd met osteotomy. Patient states NO PAIN, denies calf pain, denies headache, chest pain, shortness of breath, vomiting, fever, or chills. No other issues noted.   Patient Active Problem List   Diagnosis Date Noted  . Hypokalemia 06/12/2018  . TMJ (dislocation of temporomandibular joint) 06/06/2017  . Annual physical exam 05/31/2016  . Hematuria 05/31/2016  . Allergic rhinitis 12/12/2015  . Benign essential hypertension 12/12/2015  . Bunion 12/12/2015  . Capsulitis of foot 12/12/2015  . Class 1 obesity in adult 12/12/2015  . Effusion of knee 12/12/2015  . Gastroesophageal reflux disease without esophagitis 12/12/2015  . Impaired fasting glucose 12/12/2015  . Pure hypercholesterolemia 12/12/2015  . Tobacco dependence 12/12/2015    Current Outpatient Medications on File Prior to Visit  Medication Sig Dispense Refill  . amLODipine (NORVASC) 10 MG tablet Take 10 mg by mouth daily.    Marland Kitchen buPROPion (WELLBUTRIN XL) 150 MG 24 hr tablet Take by mouth.    . fluticasone (FLONASE) 50 MCG/ACT nasal spray Place 1 spray into both nostrils as needed for allergies or rhinitis.    Marland Kitchen lisinopril (PRINIVIL,ZESTRIL) 10 MG tablet Take 10 mg by mouth daily.    Marland Kitchen lisinopril-hydrochlorothiazide (PRINZIDE,ZESTORETIC) 20-25 MG tablet Take 1 tablet by mouth daily.    Marland Kitchen losartan (COZAAR) 50 MG tablet Take by mouth.    . methocarbamol (ROBAXIN) 500 MG tablet Take 1 tablet (500 mg total) by mouth 2 (two) times daily. 20 tablet 0  . naproxen (NAPROSYN) 500 MG tablet Take 1 tablet (500 mg total) by mouth 2 (two) times daily. 30 tablet 0  . omeprazole (PRILOSEC) 40 MG capsule Take by mouth daily.  3   No current facility-administered medications on file prior to visit.     Allergies  Allergen Reactions  . Codeine Other (See Comments), Nausea And Vomiting and Nausea Only  .  Lisinopril Cough  . Erythromycin Rash    Objective: There were no vitals filed for this visit.  General: No acute distress, AAOx3  Right foot: Incisions well healed, minimal swelling to right foot, no erythema, no warmth, no drainage, no signs of infection noted, Capillary fill time <3 seconds in all digits, gross sensation present via light touch to right foot. No pain or crepitation with range of motion right foot.  No pain with calf compression.   Xray: gapping at base osteotomy with Bone growth at osteotomy site however screws intact no other findings  Assessment and Plan:  Problem List Items Addressed This Visit    None    Visit Diagnoses    Hammer toe of right foot    -  Primary   Relevant Orders   DG Foot Complete Right   Hav (hallux abducto valgus), right       Longitudinal deficiency of metatarsal bone       Post-operative state          -Patient seen and evaluated -Xrays evaluated  -Continue with CAM boot or Surgical shoe of which she has not been wearing in order to protect 1st metatarsal from rotating and screws from breaking and to assist with callus bone healing -Continue with compression anklet  -Conutine with rest, ice, elevation and ace to assist with swelling and edema control -May work sitting roles only no excessive standing or walking to prevent issues at first metatarsal osteotomy site -Will plan for  xrays and to assess healing at next office visit. In the meantime, patient to call office if any issues or problems arise.   Landis Martins, DPM

## 2018-08-22 ENCOUNTER — Ambulatory Visit (INDEPENDENT_AMBULATORY_CARE_PROVIDER_SITE_OTHER): Payer: BC Managed Care – PPO

## 2018-08-22 ENCOUNTER — Ambulatory Visit (INDEPENDENT_AMBULATORY_CARE_PROVIDER_SITE_OTHER): Payer: BC Managed Care – PPO | Admitting: Sports Medicine

## 2018-08-22 ENCOUNTER — Encounter: Payer: Self-pay | Admitting: Sports Medicine

## 2018-08-22 DIAGNOSIS — M2041 Other hammer toe(s) (acquired), right foot: Secondary | ICD-10-CM | POA: Diagnosis not present

## 2018-08-22 DIAGNOSIS — Z9889 Other specified postprocedural states: Secondary | ICD-10-CM

## 2018-08-22 DIAGNOSIS — Q72899 Other reduction defects of unspecified lower limb: Secondary | ICD-10-CM

## 2018-08-22 DIAGNOSIS — M2011 Hallux valgus (acquired), right foot: Secondary | ICD-10-CM

## 2018-08-22 DIAGNOSIS — M79671 Pain in right foot: Secondary | ICD-10-CM

## 2018-08-22 DIAGNOSIS — Q738 Other reduction defects of unspecified limb(s): Secondary | ICD-10-CM

## 2018-08-22 NOTE — Progress Notes (Signed)
9Subjective: Joan Morris is a 52 y.o. female patient seen today in office for POV 9 (DOS 04-24-18), S/P R bunionectomy and 2nd hammertoe with 2nd met osteotomy. Patient states NO PAIN and reports that she has been using her postop shoe and that things are feeling good, denies calf pain, denies headache, chest pain, shortness of breath, vomiting, fever, or chills. No other issues noted.   Patient Active Problem List   Diagnosis Date Noted  . Hypokalemia 06/12/2018  . TMJ (dislocation of temporomandibular joint) 06/06/2017  . Annual physical exam 05/31/2016  . Hematuria 05/31/2016  . Allergic rhinitis 12/12/2015  . Benign essential hypertension 12/12/2015  . Bunion 12/12/2015  . Capsulitis of foot 12/12/2015  . Class 1 obesity in adult 12/12/2015  . Effusion of knee 12/12/2015  . Gastroesophageal reflux disease without esophagitis 12/12/2015  . Impaired fasting glucose 12/12/2015  . Pure hypercholesterolemia 12/12/2015  . Tobacco dependence 12/12/2015    Current Outpatient Medications on File Prior to Visit  Medication Sig Dispense Refill  . amLODipine (NORVASC) 10 MG tablet Take 10 mg by mouth daily.    Marland Kitchen buPROPion (WELLBUTRIN XL) 150 MG 24 hr tablet Take by mouth.    . fluticasone (FLONASE) 50 MCG/ACT nasal spray Place 1 spray into both nostrils as needed for allergies or rhinitis.    Marland Kitchen lisinopril (PRINIVIL,ZESTRIL) 10 MG tablet Take 10 mg by mouth daily.    Marland Kitchen lisinopril-hydrochlorothiazide (PRINZIDE,ZESTORETIC) 20-25 MG tablet Take 1 tablet by mouth daily.    Marland Kitchen losartan (COZAAR) 50 MG tablet Take by mouth.    . methocarbamol (ROBAXIN) 500 MG tablet Take 1 tablet (500 mg total) by mouth 2 (two) times daily. 20 tablet 0  . naproxen (NAPROSYN) 500 MG tablet Take 1 tablet (500 mg total) by mouth 2 (two) times daily. 30 tablet 0  . omeprazole (PRILOSEC) 40 MG capsule Take by mouth daily.  3   No current facility-administered medications on file prior to visit.     Allergies  Allergen  Reactions  . Codeine Other (See Comments), Nausea And Vomiting and Nausea Only  . Lisinopril Cough  . Erythromycin Rash    Objective: There were no vitals filed for this visit.  General: No acute distress, AAOx3  Right foot: Incisions well healed, minimal swelling to right foot, no erythema, no warmth, no drainage, no signs of infection noted, Capillary fill time <3 seconds in all digits, gross sensation present via light touch to right foot. No pain or crepitation with range of motion right foot.  No pain with calf compression.   Xray: Callus formation noted at at base osteotomy with screws intact and slight dorsal rotation of the distal first metatarsal however IM still reduced no other findings  Assessment and Plan:  Problem List Items Addressed This Visit    None    Visit Diagnoses    Right foot pain    -  Primary   Hammer toe of right foot       Relevant Orders   DG Foot Complete Right (Completed)   Hav (hallux abducto valgus), right       Longitudinal deficiency of metatarsal bone       Post-operative state          -Patient seen and evaluated -Xrays evaluated  -Continue with Surgical shoe x1 month protect 1st metatarsal from rotating and screws from breaking and to assist with callus bone healing and advised patient after 1 month may slowly transition to tennis shoe -Continue with compression  anklet as needed for edema control -Conutine with rest, ice, elevation and ace to assist with swelling and edema control -May work sitting roles only no excessive standing or walking to prevent issues at first metatarsal osteotomy site -Will plan for final xrays and to assess healing at next office visit and to determine if can progress activities. In the meantime, patient to call office if any issues or problems arise.   Landis Martins, DPM

## 2018-08-24 NOTE — Progress Notes (Signed)
DOS: 04-24-2018 RT; bunionectomy (double osteotomy) with internal fixation and 2nd metatarsal osteotomy with 2nd toe arthroplasty and K wire  [facility]

## 2018-10-24 ENCOUNTER — Encounter: Payer: Self-pay | Admitting: Sports Medicine

## 2018-10-24 ENCOUNTER — Ambulatory Visit (INDEPENDENT_AMBULATORY_CARE_PROVIDER_SITE_OTHER): Payer: BC Managed Care – PPO | Admitting: Sports Medicine

## 2018-10-24 ENCOUNTER — Ambulatory Visit (INDEPENDENT_AMBULATORY_CARE_PROVIDER_SITE_OTHER): Payer: BC Managed Care – PPO

## 2018-10-24 DIAGNOSIS — Q738 Other reduction defects of unspecified limb(s): Secondary | ICD-10-CM | POA: Diagnosis not present

## 2018-10-24 DIAGNOSIS — M2041 Other hammer toe(s) (acquired), right foot: Secondary | ICD-10-CM

## 2018-10-24 DIAGNOSIS — M2011 Hallux valgus (acquired), right foot: Secondary | ICD-10-CM | POA: Diagnosis not present

## 2018-10-24 DIAGNOSIS — M79671 Pain in right foot: Secondary | ICD-10-CM | POA: Diagnosis not present

## 2018-10-24 DIAGNOSIS — Z9889 Other specified postprocedural states: Secondary | ICD-10-CM

## 2018-10-24 DIAGNOSIS — Q72899 Other reduction defects of unspecified lower limb: Secondary | ICD-10-CM

## 2018-10-24 NOTE — Progress Notes (Signed)
9Subjective: Joan Morris is a 52 y.o. female patient seen today in office for POV 10 (DOS 04-24-18), S/P R bunionectomy and 2nd hammertoe with 2nd met osteotomy. Patient states she has a little bit of numbness at 2nd toe with certain shoes but otherwise she is doing good, denies calf pain, denies headache, chest pain, shortness of breath, vomiting, fever, or chills. No other issues noted.   Patient Active Problem List   Diagnosis Date Noted  . Hypokalemia 06/12/2018  . TMJ (dislocation of temporomandibular joint) 06/06/2017  . Annual physical exam 05/31/2016  . Hematuria 05/31/2016  . Allergic rhinitis 12/12/2015  . Benign essential hypertension 12/12/2015  . Bunion 12/12/2015  . Capsulitis of foot 12/12/2015  . Class 1 obesity in adult 12/12/2015  . Effusion of knee 12/12/2015  . Gastroesophageal reflux disease without esophagitis 12/12/2015  . Impaired fasting glucose 12/12/2015  . Pure hypercholesterolemia 12/12/2015  . Tobacco dependence 12/12/2015    Current Outpatient Medications on File Prior to Visit  Medication Sig Dispense Refill  . amLODipine (NORVASC) 10 MG tablet Take 10 mg by mouth daily.    Marland Kitchen buPROPion (WELLBUTRIN XL) 150 MG 24 hr tablet Take by mouth.    . ciprofloxacin (CIPRO) 500 MG tablet Take by mouth.    . fluticasone (FLONASE) 50 MCG/ACT nasal spray Place 1 spray into both nostrils as needed for allergies or rhinitis.    Marland Kitchen lisinopril (PRINIVIL,ZESTRIL) 10 MG tablet Take 10 mg by mouth daily.    Marland Kitchen lisinopril-hydrochlorothiazide (PRINZIDE,ZESTORETIC) 20-25 MG tablet Take 1 tablet by mouth daily.    Marland Kitchen losartan (COZAAR) 50 MG tablet Take by mouth.    . methocarbamol (ROBAXIN) 500 MG tablet Take 1 tablet (500 mg total) by mouth 2 (two) times daily. 20 tablet 0  . naproxen (NAPROSYN) 500 MG tablet Take 1 tablet (500 mg total) by mouth 2 (two) times daily. 30 tablet 0  . nicotine polacrilex (NICORETTE) 4 MG gum Weeks 1-6: chew 1 piece every 1-2 hours. Weeks 7-9: chew 1  piece every 2-4 hours. Weeks 10-12: chew 1 piece every 4-8 hours.    Marland Kitchen omeprazole (PRILOSEC) 40 MG capsule Take 40 mg by mouth as needed.   3   No current facility-administered medications on file prior to visit.     Allergies  Allergen Reactions  . Codeine Other (See Comments), Nausea And Vomiting and Nausea Only  . Lisinopril Cough  . Erythromycin Rash    Objective: There were no vitals filed for this visit.  General: No acute distress, AAOx3  Right foot: Incisions healed, minimium swelling to right foot, no erythema, no warmth, no drainage, no signs of infection noted, Capillary fill time <3 seconds in all digits, gross sensation present via light touch to right foot. No pain or crepitation with range of motion right foot.  No pain with calf compression.   Xray: Healed/Callus formation noted at at base osteotomy with screws intact and slight dorsal rotation of the distal first metatarsal however IM still reduced no other findings.   Assessment and Plan:  Problem List Items Addressed This Visit    None    Visit Diagnoses    Hav (hallux abducto valgus), right    -  Primary   Relevant Orders   DG Foot Complete Right   Hammer toe of right foot       Relevant Orders   DG Foot Complete Right   Longitudinal deficiency of metatarsal bone       Relevant Orders  DG Foot Complete Right   Right foot pain       Post-operative state          -Patient seen and evaluated -Xrays evaluated   -Osteotomy healed -Conutine with good supportive shoes  -May resume normal activities  -Return PRN or sooner if problems arise.   Landis Martins, DPM

## 2019-04-03 ENCOUNTER — Other Ambulatory Visit: Payer: Self-pay

## 2019-04-03 ENCOUNTER — Emergency Department (HOSPITAL_BASED_OUTPATIENT_CLINIC_OR_DEPARTMENT_OTHER)
Admission: EM | Admit: 2019-04-03 | Discharge: 2019-04-03 | Disposition: A | Discharge status: Home / Self Care | Payer: BC Managed Care – PPO | Attending: Emergency Medicine | Admitting: Emergency Medicine

## 2019-04-03 ENCOUNTER — Encounter (HOSPITAL_BASED_OUTPATIENT_CLINIC_OR_DEPARTMENT_OTHER): Payer: Self-pay

## 2019-04-03 ENCOUNTER — Emergency Department (HOSPITAL_BASED_OUTPATIENT_CLINIC_OR_DEPARTMENT_OTHER): Payer: BC Managed Care – PPO

## 2019-04-03 DIAGNOSIS — W010XXA Fall on same level from slipping, tripping and stumbling without subsequent striking against object, initial encounter: Secondary | ICD-10-CM | POA: Diagnosis not present

## 2019-04-03 DIAGNOSIS — M5431 Sciatica, right side: Secondary | ICD-10-CM

## 2019-04-03 DIAGNOSIS — Z79899 Other long term (current) drug therapy: Secondary | ICD-10-CM | POA: Diagnosis not present

## 2019-04-03 DIAGNOSIS — F17210 Nicotine dependence, cigarettes, uncomplicated: Secondary | ICD-10-CM | POA: Diagnosis not present

## 2019-04-03 DIAGNOSIS — M25551 Pain in right hip: Secondary | ICD-10-CM | POA: Diagnosis present

## 2019-04-03 MED ORDER — CYCLOBENZAPRINE HCL 10 MG PO TABS: 10.0000 mg | ORAL_TABLET | Freq: Two times a day (BID) | ORAL | 0 refills | Status: AC | PRN

## 2019-04-03 NOTE — ED Notes (Signed)
ED Provider at bedside. 

## 2019-04-03 NOTE — ED Triage Notes (Signed)
Pt states she tripped/fell 2 night ago-pain to lower back-right hip right LE-NAD-limping gait-NAD

## 2019-04-03 NOTE — Discharge Instructions (Signed)
Continue taking the 3 ibuprofen every 6 hours and the nighttime pain reliever for the pain.  You can also use the muscle relaxer but may make you drowsy.

## 2019-04-03 NOTE — ED Provider Notes (Signed)
[facility] EMERGENCY DEPARTMENT Provider Note   CSN: [id number] Arrival date & time: 04/03/19  1908    History   Chief Complaint Chief Complaint  Patient presents with  . Fall    HPI Joan Morris is a 53 y.o. female.     Patient is a 53 year old female with a history of GERD, hypertension who is presenting today with right hip pain.  Patient states 2 days ago she was cleaning her house when she tripped over a pair of tennis shoes and she fell to the floor.  As she was falling she twisted and mostly caught herself.  She initially was feeling fine but yesterday when she woke up she has having pain in her right hip that shot down her leg.  Symptoms worsened today and the most severe pain was 8 out of 10.  The pain is sharp in nature and much worse when she attempts to walk and move around.  It is significantly improved when she lays down and props up her right leg.  She denies any numbness or weakness of the leg.  She has no back pain.  She denies any other injury and her left side feels fine.  No prior history of back issues.  No urinary or bowel complaints.  The history is provided by the patient.    Past Medical History:  Diagnosis Date  . Hypertension     Patient Active Problem List   Diagnosis Date Noted  . Hypokalemia 06/12/2018  . TMJ (dislocation of temporomandibular joint) 06/06/2017  . Annual physical exam 05/31/2016  . Hematuria 05/31/2016  . Allergic rhinitis 12/12/2015  . Benign essential hypertension 12/12/2015  . Bunion 12/12/2015  . Capsulitis of foot 12/12/2015  . Class 1 obesity in adult 12/12/2015  . Effusion of knee 12/12/2015  . Gastroesophageal reflux disease without esophagitis 12/12/2015  . Impaired fasting glucose 12/12/2015  . Pure hypercholesterolemia 12/12/2015  . Tobacco dependence 12/12/2015    Past Surgical History:  Procedure Laterality Date  . FOOT SURGERY    . TUBAL LIGATION       OB History   No obstetric history on  file.      Home Medications    Prior to Admission medications   Medication Sig Start Date End Date Taking? Authorizing Provider  amLODipine (NORVASC) 10 MG tablet Take 10 mg by mouth daily.    [provider]  buPROPion (WELLBUTRIN XL) 150 MG 24 hr tablet Take by mouth. 12/09/17   [provider]  fluticasone (FLONASE) 50 MCG/ACT nasal spray Place 1 spray into both nostrils as needed for allergies or rhinitis.    [provider]  lisinopril (PRINIVIL,ZESTRIL) 10 MG tablet Take 10 mg by mouth daily.    [provider]  lisinopril-hydrochlorothiazide (PRINZIDE,ZESTORETIC) 20-25 MG tablet Take 1 tablet by mouth daily.    [provider]  losartan (COZAAR) 50 MG tablet Take by mouth. 12/09/17   [provider]  methocarbamol (ROBAXIN) 500 MG tablet Take 1 tablet (500 mg total) by mouth 2 (two) times daily. 12/07/15   Patel-Mills, Lorelle Formosa, PA-C  naproxen (NAPROSYN) 500 MG tablet Take 1 tablet (500 mg total) by mouth 2 (two) times daily. 12/07/15   Patel-Mills, Lorelle Formosa, PA-C  omeprazole (PRILOSEC) 40 MG capsule Take 40 mg by mouth as needed.  01/19/18   [provider]    Family History No family history on file.  Social History Social History   Tobacco Use  . Smoking status: Current Every Day  Smoker    Types: Cigarettes  . Smokeless tobacco: Never Used  Substance Use Topics  . Alcohol use: No  . Drug use: No     Allergies   Codeine; Lisinopril; and Erythromycin   Review of Systems Review of Systems  All other systems reviewed and are negative.    Physical Exam Updated Vital Signs BP (!) 173/78 (BP Location: Left Arm)   Pulse 90   Temp 98.7 F (37.1 C) (Oral)   Resp 18   Ht 5\' 6"  (1.676 m)   Wt 78.9 kg   SpO2 99%   BMI 28.08 kg/m   Physical Exam Vitals signs and nursing note reviewed.  Constitutional:      Appearance: She is normal weight.  HENT:     Head: Normocephalic and atraumatic.     Nose: Nose  normal.  Cardiovascular:     Pulses: Normal pulses.  Pulmonary:     Effort: Pulmonary effort is normal.  Musculoskeletal:     Lumbar back: She exhibits no bony tenderness.       Back:     Comments: Pain with palpation over the buttocks and sciatic notch  Skin:    General: Skin is warm and dry.     Capillary Refill: Capillary refill takes less than 2 seconds.  Neurological:     General: No focal deficit present.     Mental Status: She is alert and oriented to person, place, and time. Mental status is at baseline.     Comments: 5/5 strength in bilateral upper and lower leg.  Normal plantar and dorsiflexion of the feet.  Normal sensation inboth lower ext.  Limping gait  Psychiatric:        Mood and Affect: Mood normal.        Behavior: Behavior normal.        Thought Content: Thought content normal.      ED Treatments / Results  Labs (all labs ordered are listed, but only abnormal results are displayed) Labs Reviewed - No data to display  EKG None  Radiology Dg Hip Unilat W Or Wo Pelvis 2-3 Views Right  Result Date: 04/03/2019 CLINICAL DATA:  Right hip pain since a fall 2 days ago. Initial encounter. EXAM: DG HIP (WITH OR WITHOUT PELVIS) 2-3V RIGHT COMPARISON:  None. FINDINGS: There is no evidence of hip fracture or dislocation. There is no evidence of arthropathy or other focal bone abnormality. IMPRESSION: Negative exam. Electronically Signed   By: Drusilla Kannerhomas  Dalessio M.D.   On: 04/03/2019 20:21    Procedures Procedures (including critical care time)  Medications Ordered in ED Medications - No data to display   Initial Impression / Assessment and Plan / ED Course  I have reviewed the triage vital signs and the nursing notes.  Pertinent labs & imaging results that were available during my care of the patient were reviewed by me and considered in my medical decision making (see chart for details).       53 year old female presenting today with persistent leg pain after  a fall 2 days ago.  Patient symptoms seem more like sciatica with pain that shoots down the back of her leg.  She states that in the fall she twisted when she fell which could have caused the symptoms.  She has pain over the sciatic notch but no decreased range of motion of the hip.  Pain is worse with walking and better with being still.  She has no evidence of weakness or numbness.  Plain films of the hip are within normal limits.  Will treat patient for sciatica.  She has been taking 3 ibuprofen and some nighttime medicine with the Tylenol and it which makes her pain tolerable.  Will add in a muscle relaxer and give follow-up if symptoms do not improve  Final Clinical Impressions(s) / ED Diagnoses   Final diagnoses:  Sciatica of right side    ED Discharge Orders         Ordered    cyclobenzaprine (FLEXERIL) 10 MG tablet  2 times daily PRN     04/03/19 2043           Gwyneth Sprout, MD 04/03/19 2044

## 2019-04-10 ENCOUNTER — Encounter: Payer: Self-pay | Admitting: Family Medicine

## 2019-04-10 ENCOUNTER — Other Ambulatory Visit: Payer: Self-pay

## 2019-04-10 ENCOUNTER — Ambulatory Visit (INDEPENDENT_AMBULATORY_CARE_PROVIDER_SITE_OTHER): Payer: BC Managed Care – PPO | Admitting: Family Medicine

## 2019-04-10 VITALS — BP 141/79 | HR 82 | Ht 66.0 in | Wt 174.0 lb

## 2019-04-10 DIAGNOSIS — M5431 Sciatica, right side: Secondary | ICD-10-CM

## 2019-04-10 MED ORDER — PREDNISONE 5 MG PO TABS: ORAL_TABLET | ORAL | 0 refills | Status: AC

## 2019-04-10 NOTE — Progress Notes (Signed)
Joan Morris - 53 y.o. female MRN [id number]  Date of birth: August 31, 1966  SUBJECTIVE:  Including CC & ROS.  Chief Complaint  Patient presents with  . Back Pain    right-sided low back    Joan Morris is a 53 y.o. female that is presenting with pain radiating down the right lateral side of her hip and lower leg.  The pain is been ongoing for about a week.  The pain is intermittent in nature.  It can be severe and sharp in nature.  Seems to be worse when she walks and improves when she lies down.  Denies any saddle anesthesia or urinary incontinence.  No history of similar symptoms.  No history of surgery on the back.  Independent review of the right hip x-ray from 5/12 shows no significant bony abnormalities.   Review of Systems  Constitutional: Negative for fever.  HENT: Negative for congestion.   Respiratory: Negative for cough.   Cardiovascular: Negative for chest pain.  Gastrointestinal: Negative for abdominal pain.  Musculoskeletal: Negative for gait problem.  Skin: Negative for color change.  Neurological: Negative for weakness.  Hematological: Negative for adenopathy.    HISTORY: Past Medical, Surgical, Social, and Family History Reviewed & Updated per EMR.   Pertinent Historical Findings include:  Past Medical History:  Diagnosis Date  . Hypertension     Past Surgical History:  Procedure Laterality Date  . FOOT SURGERY    . TUBAL LIGATION      Allergies  Allergen Reactions  . Codeine Other (See Comments), Nausea And Vomiting and Nausea Only  . Lisinopril Cough  . Erythromycin Rash    No family history on file.   Social History   Socioeconomic History  . Marital status: Married    Spouse name: Not on file  . Number of children: Not on file  . Years of education: Not on file  . Highest education level: Not on file  Occupational History  . Not on file  Social Needs  . Financial resource strain: Not on file  . Food insecurity:    Worry: Not on file     Inability: Not on file  . Transportation needs:    Medical: Not on file    Non-medical: Not on file  Tobacco Use  . Smoking status: Current Every Day Smoker    Types: Cigarettes  . Smokeless tobacco: Never Used  Substance and Sexual Activity  . Alcohol use: No  . Drug use: No  . Sexual activity: Not on file  Lifestyle  . Physical activity:    Days per week: Not on file    Minutes per session: Not on file  . Stress: Not on file  Relationships  . Social connections:    Talks on phone: Not on file    Gets together: Not on file    Attends religious service: Not on file    Active member of club or organization: Not on file    Attends meetings of clubs or organizations: Not on file    Relationship status: Not on file  . Intimate partner violence:    Fear of current or ex partner: Not on file    Emotionally abused: Not on file    Physically abused: Not on file    Forced sexual activity: Not on file  Other Topics Concern  . Not on file  Social History Narrative  . Not on file     PHYSICAL EXAM:  VS: BP (!) 141/79   Pulse  82   Ht 5\' 6"  (1.676 m)   Wt 174 lb (78.9 kg)   BMI 28.08 kg/m  Physical Exam Gen: NAD, alert, cooperative with exam, well-appearing ENT: normal lips, normal nasal mucosa,  Eye: normal EOM, normal conjunctiva and lids CV:  no edema, +2 pedal pulses   Resp: no accessory muscle use, non-labored,  Skin: no rashes, no areas of induration  Neuro: normal tone, normal sensation to touch Psych:  normal insight, alert and oriented MSK:  Back/right hip: No tenderness to palpation of the greater trochanter. Normal internal and external rotation of the hip. Normal strength resistance with hip flexion, knee flexion extension, plantarflexion and dorsiflexion. Symmetric deep tendon reflexes at the patella. Negative straight leg raise. Normal gait. Neurovascular intact     ASSESSMENT & PLAN:   Sciatica of right side Symptoms are suggestive of sciatica.   Does not appear to be any involvement of the hip joint or the greater trochanter. -Provided prednisone. -Counseled on home exercise therapy and supportive care. -If no improvement will consider imaging of the lumbar spine versus physical therapy or gabapentin.

## 2019-04-10 NOTE — Patient Instructions (Signed)
Nice to meet you Please try ice or heat on the lower back  Please try the exercises and stretches  Please try aspercreame with lidocaine.  Please send me a message in MyChart with any questions or updates.  Please see me back in 4 weeks if no better .   --Dr. Jordan Likes

## 2019-04-11 DIAGNOSIS — M5431 Sciatica, right side: Secondary | ICD-10-CM | POA: Insufficient documentation

## 2019-04-11 NOTE — Assessment & Plan Note (Signed)
Symptoms are suggestive of sciatica.  Does not appear to be any involvement of the hip joint or the greater trochanter. -Provided prednisone. -Counseled on home exercise therapy and supportive care. -If no improvement will consider imaging of the lumbar spine versus physical therapy or gabapentin.

## 2019-05-08 ENCOUNTER — Encounter: Payer: Self-pay | Admitting: Family Medicine

## 2019-05-08 ENCOUNTER — Ambulatory Visit (INDEPENDENT_AMBULATORY_CARE_PROVIDER_SITE_OTHER): Payer: BC Managed Care – PPO | Admitting: Family Medicine

## 2019-05-08 ENCOUNTER — Other Ambulatory Visit: Payer: Self-pay

## 2019-05-08 DIAGNOSIS — M5431 Sciatica, right side: Secondary | ICD-10-CM | POA: Diagnosis not present

## 2019-05-08 NOTE — Patient Instructions (Signed)
Nice to meet you Please try ibuprofen if the pain flares up on you  Please try heat and massage  Please send me a message in MyChart with any questions or updates.  Please see me back if needed.   --Dr. Raeford Razor

## 2019-05-08 NOTE — Progress Notes (Signed)
Joan Morris - 53 y.o. female MRN [id number]  Date of birth: 09-15-1966  SUBJECTIVE:  Including CC & ROS.  Chief Complaint  Patient presents with  . Follow-up    follow up for right-sided sciatica    Joan QuinRosalind Michetti is a 53 y.o. female that is reports significant improvement of her pain. Has been doing the home exercises and these seem to be helping. Denies any pain today. Reports no numbness or tingling.    Review of Systems  Constitutional: Negative for fever.  HENT: Negative for congestion.   Respiratory: Negative for cough.   Cardiovascular: Negative for chest pain.  Gastrointestinal: Negative for abdominal pain.  Musculoskeletal: Negative for gait problem.  Skin: Negative for color change.  Neurological: Negative for weakness.  Hematological: Negative for adenopathy.    HISTORY: Past Medical, Surgical, Social, and Family History Reviewed & Updated per EMR.   Pertinent Historical Findings include:  Past Medical History:  Diagnosis Date  . Hypertension     Past Surgical History:  Procedure Laterality Date  . FOOT SURGERY    . TUBAL LIGATION      Allergies  Allergen Reactions  . Codeine Other (See Comments), Nausea And Vomiting and Nausea Only  . Lisinopril Cough  . Erythromycin Rash    No family history on file.   Social History   Socioeconomic History  . Marital status: Married    Spouse name: Not on file  . Number of children: Not on file  . Years of education: Not on file  . Highest education level: Not on file  Occupational History  . Not on file  Social Needs  . Financial resource strain: Not on file  . Food insecurity    Worry: Not on file    Inability: Not on file  . Transportation needs    Medical: Not on file    Non-medical: Not on file  Tobacco Use  . Smoking status: Current Every Day Smoker    Types: Cigarettes  . Smokeless tobacco: Never Used  Substance and Sexual Activity  . Alcohol use: No  . Drug use: No  . Sexual activity: Not  on file  Lifestyle  . Physical activity    Days per week: Not on file    Minutes per session: Not on file  . Stress: Not on file  Relationships  . Social Musicianconnections    Talks on phone: Not on file    Gets together: Not on file    Attends religious service: Not on file    Active member of club or organization: Not on file    Attends meetings of clubs or organizations: Not on file    Relationship status: Not on file  . Intimate partner violence    Fear of current or ex partner: Not on file    Emotionally abused: Not on file    Physically abused: Not on file    Forced sexual activity: Not on file  Other Topics Concern  . Not on file  Social History Narrative  . Not on file     PHYSICAL EXAM:  VS: BP (!) 148/78   Pulse 84   Ht 5\' 6"  (1.676 m)   Wt 173 lb (78.5 kg)   BMI 27.92 kg/m  Physical Exam Gen: NAD, alert, cooperative with exam, well-appearing ENT: normal lips, normal nasal mucosa,  Eye: normal EOM, normal conjunctiva and lids CV:  no edema, +2 pedal pulses   Resp: no accessory muscle use, non-labored,  Skin: no rashes,  no areas of induration  Neuro: normal tone, normal sensation to touch Psych:  normal insight, alert and oriented MSK:  Back:  Normal strength to resistance  Normal ROM  Neurovascularly intact       ASSESSMENT & PLAN:   Sciatica of right side Reports significant improvement.  - counseled on HEP and supportive care - given indications to return.

## 2019-05-08 NOTE — Assessment & Plan Note (Signed)
Reports significant improvement.  - counseled on HEP and supportive care - given indications to return.

## 2020-07-07 IMAGING — DX DG HIP (WITH OR WITHOUT PELVIS) 2-3V RIGHT
3 series · 3 of 3 positions shown · non-contrast
Comparison: None.

CLINICAL DATA: Right hip pain since a fall 2 days ago. Initial
encounter.

EXAM:
DG HIP (WITH OR WITHOUT PELVIS) 2-3V RIGHT

[pelvis ap]
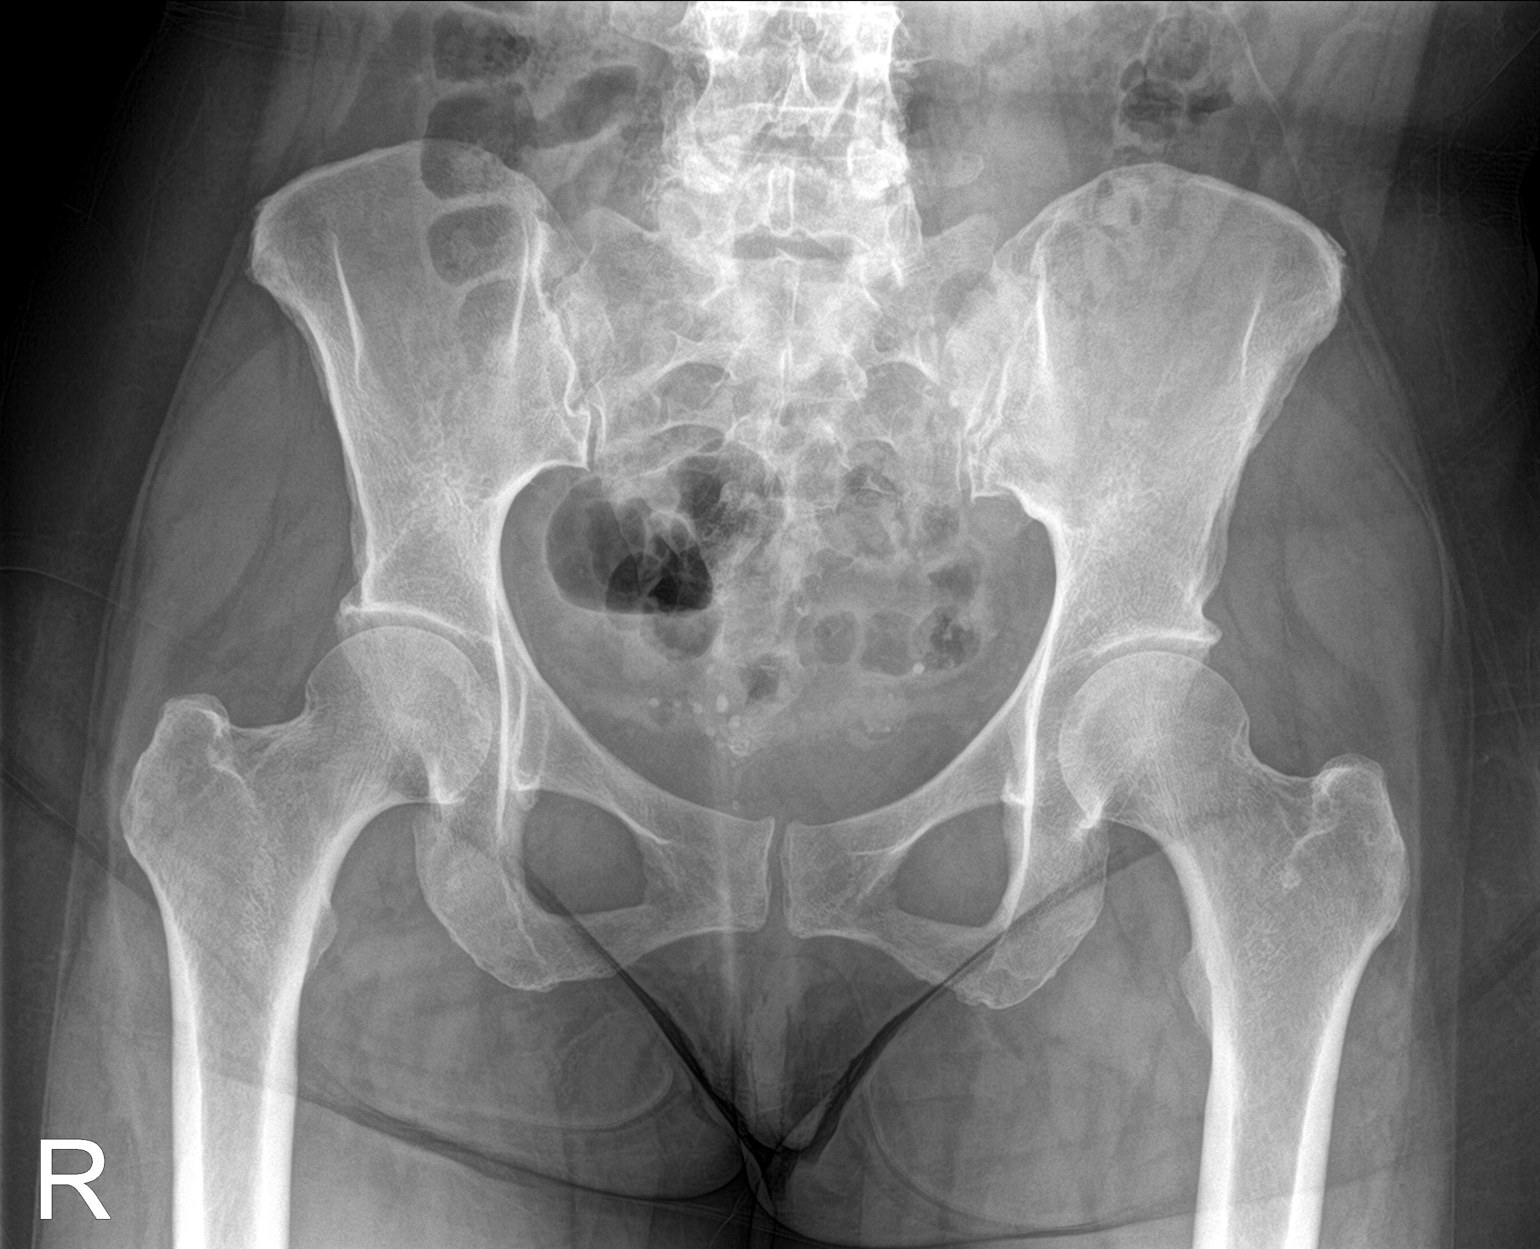

[hip ap]
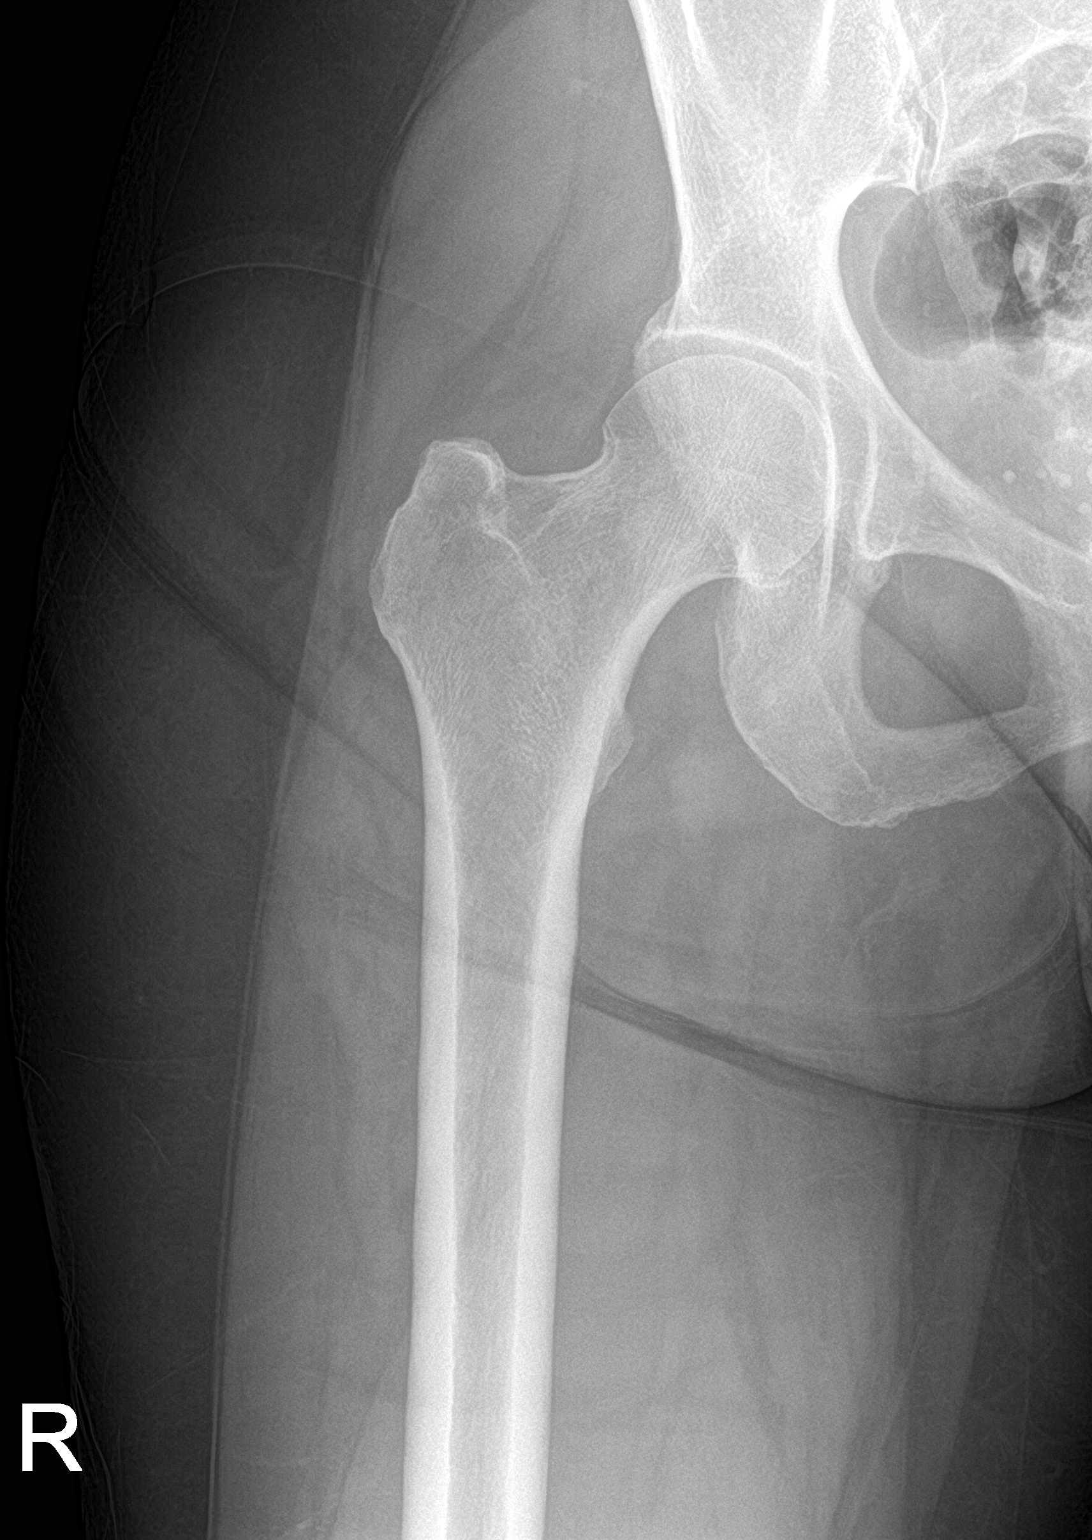

[hip lat]
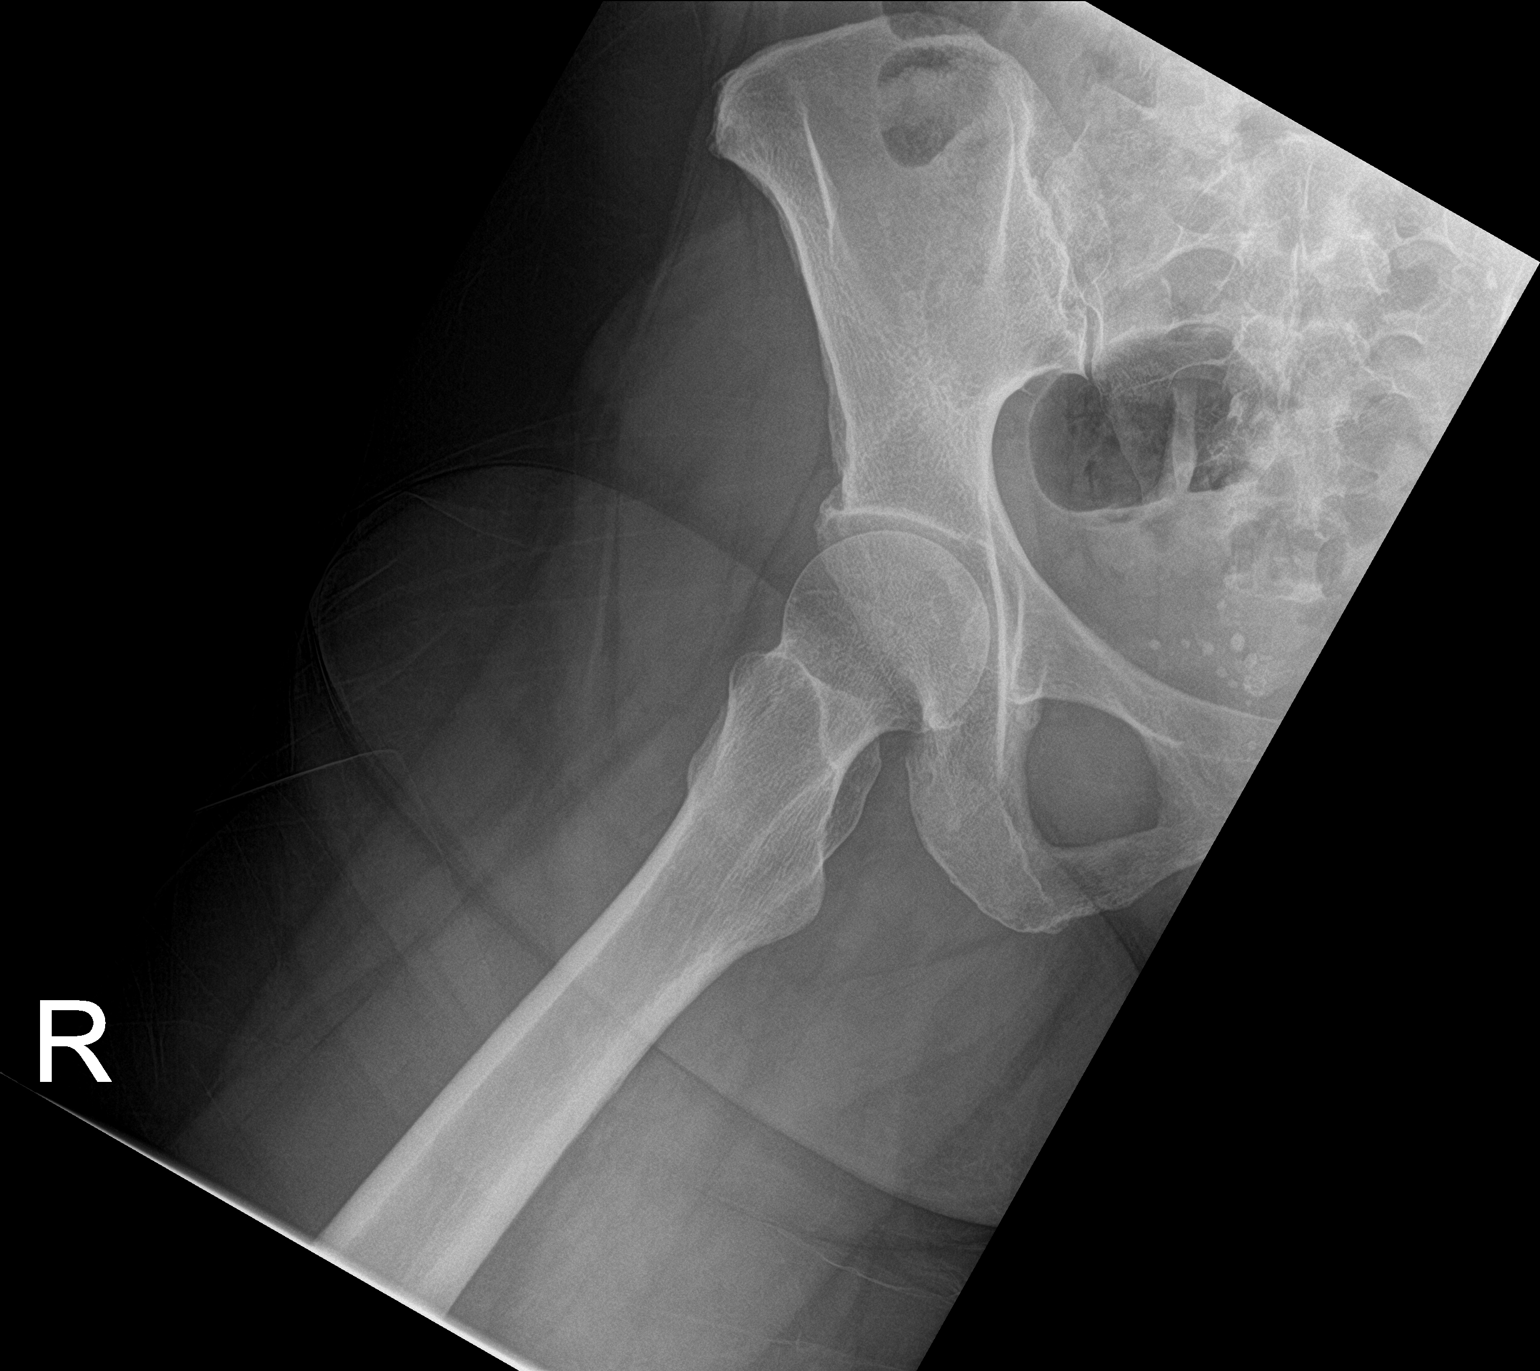

[3 of 3 positions shown; findings below may reference images not displayed]

FINDINGS: There is no evidence of hip fracture or dislocation. There is no
evidence of arthropathy or other focal bone abnormality.
IMPRESSION: Negative exam.

## 2023-03-07 ENCOUNTER — Encounter: Payer: Self-pay | Admitting: *Deleted
